# Patient Record
Sex: Female | Born: 1975 | Race: Black or African American | Hispanic: No | Marital: Married | Smoking: Never smoker
Health system: Southern US, Community
[De-identification: ages and names within clinical notes are randomized; demographics above are authoritative.]

## PROBLEM LIST (undated history)

## (undated) DIAGNOSIS — T8859XA Other complications of anesthesia, initial encounter: Secondary | ICD-10-CM

## (undated) DIAGNOSIS — Z9889 Other specified postprocedural states: Secondary | ICD-10-CM

## (undated) DIAGNOSIS — R112 Nausea with vomiting, unspecified: Secondary | ICD-10-CM

## (undated) DIAGNOSIS — K219 Gastro-esophageal reflux disease without esophagitis: Secondary | ICD-10-CM

## (undated) DIAGNOSIS — T4145XA Adverse effect of unspecified anesthetic, initial encounter: Secondary | ICD-10-CM

## (undated) DIAGNOSIS — E282 Polycystic ovarian syndrome: Secondary | ICD-10-CM

## (undated) DIAGNOSIS — I499 Cardiac arrhythmia, unspecified: Secondary | ICD-10-CM

## (undated) HISTORY — PX: OTHER SURGICAL HISTORY: SHX169

## (undated) HISTORY — PX: BREAST BIOPSY: SHX20

---
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
## 1998-04-23 ENCOUNTER — Emergency Department (HOSPITAL_COMMUNITY): Admission: EM | Admit: 1998-04-23 | Discharge: 1998-04-23 | Payer: Self-pay | Admitting: Emergency Medicine

## 1998-09-04 ENCOUNTER — Emergency Department (HOSPITAL_COMMUNITY): Admission: EM | Admit: 1998-09-04 | Discharge: 1998-09-04 | Payer: Self-pay

## 1998-09-06 ENCOUNTER — Encounter: Payer: Self-pay | Admitting: Emergency Medicine

## 1998-09-06 ENCOUNTER — Ambulatory Visit (HOSPITAL_COMMUNITY): Admission: RE | Admit: 1998-09-06 | Discharge: 1998-09-06 | Payer: Self-pay | Admitting: Emergency Medicine

## 1998-09-09 ENCOUNTER — Emergency Department (HOSPITAL_COMMUNITY): Admission: EM | Admit: 1998-09-09 | Discharge: 1998-09-09 | Payer: Self-pay | Admitting: Emergency Medicine

## 1998-09-09 ENCOUNTER — Encounter: Payer: Self-pay | Admitting: Emergency Medicine

## 1999-05-23 ENCOUNTER — Emergency Department (HOSPITAL_COMMUNITY): Admission: EM | Admit: 1999-05-23 | Discharge: 1999-05-23 | Payer: Self-pay | Admitting: Emergency Medicine

## 2005-08-31 ENCOUNTER — Other Ambulatory Visit: Admission: RE | Admit: 2005-08-31 | Discharge: 2005-08-31 | Payer: Self-pay | Admitting: Obstetrics and Gynecology

## 2006-03-05 ENCOUNTER — Other Ambulatory Visit: Admission: RE | Admit: 2006-03-05 | Discharge: 2006-03-05 | Payer: Self-pay | Admitting: Obstetrics and Gynecology

## 2006-06-20 ENCOUNTER — Other Ambulatory Visit: Admission: RE | Admit: 2006-06-20 | Discharge: 2006-06-20 | Payer: Self-pay | Admitting: Obstetrics and Gynecology

## 2006-07-21 ENCOUNTER — Emergency Department (HOSPITAL_COMMUNITY): Admission: EM | Admit: 2006-07-21 | Discharge: 2006-07-21 | Payer: Self-pay | Admitting: Emergency Medicine

## 2006-10-22 ENCOUNTER — Encounter: Admission: RE | Admit: 2006-10-22 | Discharge: 2006-10-22 | Payer: Self-pay | Admitting: Obstetrics and Gynecology

## 2007-02-19 ENCOUNTER — Other Ambulatory Visit: Admission: RE | Admit: 2007-02-19 | Discharge: 2007-02-19 | Payer: Self-pay | Admitting: Obstetrics and Gynecology

## 2007-09-24 ENCOUNTER — Other Ambulatory Visit: Admission: RE | Admit: 2007-09-24 | Discharge: 2007-09-24 | Payer: Self-pay | Admitting: Obstetrics and Gynecology

## 2008-04-07 ENCOUNTER — Encounter: Payer: Self-pay | Admitting: Internal Medicine

## 2008-04-22 ENCOUNTER — Encounter: Payer: Self-pay | Admitting: Internal Medicine

## 2008-04-22 ENCOUNTER — Ambulatory Visit (HOSPITAL_COMMUNITY): Admission: RE | Admit: 2008-04-22 | Discharge: 2008-04-22 | Payer: Self-pay | Admitting: Family Medicine

## 2008-05-15 ENCOUNTER — Ambulatory Visit: Payer: Self-pay | Admitting: Internal Medicine

## 2008-05-15 DIAGNOSIS — B977 Papillomavirus as the cause of diseases classified elsewhere: Secondary | ICD-10-CM | POA: Insufficient documentation

## 2008-05-15 DIAGNOSIS — R05 Cough: Secondary | ICD-10-CM

## 2008-05-15 DIAGNOSIS — K219 Gastro-esophageal reflux disease without esophagitis: Secondary | ICD-10-CM | POA: Insufficient documentation

## 2008-05-15 DIAGNOSIS — R059 Cough, unspecified: Secondary | ICD-10-CM | POA: Insufficient documentation

## 2008-05-15 DIAGNOSIS — J42 Unspecified chronic bronchitis: Secondary | ICD-10-CM | POA: Insufficient documentation

## 2008-05-15 DIAGNOSIS — E282 Polycystic ovarian syndrome: Secondary | ICD-10-CM | POA: Insufficient documentation

## 2008-11-02 ENCOUNTER — Other Ambulatory Visit: Admission: RE | Admit: 2008-11-02 | Discharge: 2008-11-02 | Payer: Self-pay | Admitting: Obstetrics and Gynecology

## 2009-11-01 ENCOUNTER — Other Ambulatory Visit: Admission: RE | Admit: 2009-11-01 | Discharge: 2009-11-01 | Payer: Self-pay | Admitting: Obstetrics and Gynecology

## 2009-11-02 ENCOUNTER — Encounter: Admission: RE | Admit: 2009-11-02 | Discharge: 2009-11-02 | Payer: Self-pay | Admitting: Obstetrics and Gynecology

## 2010-11-03 ENCOUNTER — Other Ambulatory Visit (HOSPITAL_COMMUNITY)
Admission: RE | Admit: 2010-11-03 | Discharge: 2010-11-03 | Disposition: A | Discharge status: Home / Self Care | Payer: BC Managed Care – PPO | Source: Ambulatory Visit | Attending: Obstetrics and Gynecology | Admitting: Obstetrics and Gynecology

## 2010-11-03 ENCOUNTER — Other Ambulatory Visit: Payer: Self-pay | Admitting: Obstetrics and Gynecology

## 2010-11-03 DIAGNOSIS — Z113 Encounter for screening for infections with a predominantly sexual mode of transmission: Secondary | ICD-10-CM | POA: Insufficient documentation

## 2010-11-03 DIAGNOSIS — R87810 Cervical high risk human papillomavirus (HPV) DNA test positive: Secondary | ICD-10-CM | POA: Insufficient documentation

## 2010-11-03 DIAGNOSIS — Z01419 Encounter for gynecological examination (general) (routine) without abnormal findings: Secondary | ICD-10-CM | POA: Insufficient documentation

## 2011-08-18 ENCOUNTER — Encounter (HOSPITAL_COMMUNITY): Payer: Self-pay | Admitting: Emergency Medicine

## 2011-08-18 ENCOUNTER — Emergency Department (HOSPITAL_COMMUNITY)
Admission: EM | Admit: 2011-08-18 | Discharge: 2011-08-18 | Disposition: A | Discharge status: Home / Self Care | Payer: BC Managed Care – PPO | Attending: Emergency Medicine | Admitting: Emergency Medicine

## 2011-08-18 DIAGNOSIS — M62838 Other muscle spasm: Secondary | ICD-10-CM | POA: Insufficient documentation

## 2011-08-18 DIAGNOSIS — M25519 Pain in unspecified shoulder: Secondary | ICD-10-CM | POA: Insufficient documentation

## 2011-08-18 DIAGNOSIS — R110 Nausea: Secondary | ICD-10-CM | POA: Insufficient documentation

## 2011-08-18 HISTORY — DX: Polycystic ovarian syndrome: E28.2

## 2011-08-18 HISTORY — DX: Gastro-esophageal reflux disease without esophagitis: K21.9

## 2011-08-18 MED ORDER — PROMETHAZINE HCL 25 MG PO TABS: 12.5000 mg | ORAL_TABLET | Freq: Four times a day (QID) | ORAL | Status: DC | PRN

## 2011-08-18 MED ORDER — METHOCARBAMOL 500 MG PO TABS: 1000.0000 mg | ORAL_TABLET | Freq: Four times a day (QID) | ORAL | Status: AC

## 2011-08-18 MED ORDER — HYDROCODONE-ACETAMINOPHEN 5-325 MG PO TABS: ORAL_TABLET | ORAL | Status: AC

## 2011-08-18 MED ORDER — NAPROXEN 500 MG PO TABS: 500.0000 mg | ORAL_TABLET | Freq: Two times a day (BID) | ORAL | Status: AC

## 2011-08-18 NOTE — ED Provider Notes (Signed)
Medical screening examination/treatment/procedure(s) were performed by non-physician practitioner and as supervising physician I was immediately available for consultation/collaboration.   Hanley Seamen, MD 08/18/11 1235

## 2011-08-18 NOTE — ED Provider Notes (Signed)
History     CSN: [id number]  Arrival date & time 08/18/11  5284   First MD Initiated Contact with Patient 08/18/11 [phone]      Chief Complaint  Patient presents with  . Shoulder Pain    (Consider location/radiation/quality/duration/timing/severity/associated sxs/prior treatment) HPI Comments: Patient present in ED with 9/10 L shoulder pain.  She reports folding laundry but did not have exact mechanism.  The pain is shooting and travels down to elbow but spares forearm and wrist.  Denies neck pain, chest pain, dyspnea, numbness and tingling.  No prev hx of L shoulder/elbow injury.  No prev hx of neck pain or injury.  Reports moderate reflux therefore she can only take Aleve for pain relief.  States she took a Vicodin last night but it made her nauseated.  No allergies       Patient is a 36 y.o. female presenting with shoulder pain. The history is provided by the patient.  Shoulder Pain This is a new problem. The current episode started yesterday. The problem occurs constantly. The problem has been unchanged. Associated symptoms include arthralgias and nausea. Pertinent negatives include no abdominal pain, chest pain, diaphoresis, fever, neck pain, numbness, vomiting or weakness. The symptoms are aggravated by bending. She has tried oral narcotics for the symptoms. The treatment provided mild relief.    Past Medical History  Diagnosis Date  . GERD (gastroesophageal reflux disease)   . PCOS (polycystic ovarian syndrome)     Past Surgical History  Procedure Date  . Removal of fibroids     History reviewed. No pertinent family history.  History  Substance Use Topics  . Smoking status: Never Smoker   . Smokeless tobacco: Not on file  . Alcohol Use: No    OB History    Grav Para Term Preterm Abortions TAB SAB Ect Mult Living                  Review of Systems  Constitutional: Negative for fever and diaphoresis.  HENT: Negative for neck pain and neck stiffness.   Respiratory:  Negative for chest tightness and shortness of breath.   Cardiovascular: Negative for chest pain.  Gastrointestinal: Positive for nausea. Negative for vomiting and abdominal pain.  Musculoskeletal: Positive for arthralgias. Negative for back pain.  Skin: Negative for color change and wound.  Neurological: Negative for weakness and numbness.    Allergies  Review of patient's allergies indicates no known allergies.  Home Medications   Current Outpatient Rx  Name Route Sig Dispense Refill  . VITAMIN D 1000 UNITS PO TABS Oral Take 1,000 Units by mouth daily.    Marland Kitchen OMEPRAZOLE 20 MG PO CPDR Oral Take 20 mg by mouth daily.    Marland Kitchen PRENATAL MULTIVITAMIN CH Oral Take 1 tablet by mouth daily.    Marland Kitchen PROMETHAZINE HCL 12.5 MG PO TABS Oral Take 12.5 mg by mouth every 6 (six) hours as needed. nausea      BP 126/83  Pulse 83  Temp(Src) 98 F (36.7 C) (Oral)  Resp 16  SpO2 100%  LMP 07/27/2011  Physical Exam  Nursing note and vitals reviewed. Constitutional: She is oriented to person, place, and time. She appears well-developed and well-nourished.  HENT:  Head: Normocephalic and atraumatic.  Eyes: Pupils are equal, round, and reactive to light.  Neck: Neck supple. Muscular tenderness present. No spinous process tenderness present. Decreased range of motion present.       Decreased Right lateral flexion due to muscle spasm  Cardiovascular: Normal rate, regular rhythm, normal heart sounds and intact distal pulses.   Pulmonary/Chest: Effort normal and breath sounds normal.  Musculoskeletal: She exhibits no edema and no tenderness.       Left shoulder: She exhibits decreased range of motion, tenderness, pain, spasm and decreased strength. She exhibits no bony tenderness, no crepitus, no deformity and normal pulse.       Left elbow: She exhibits normal range of motion, no swelling and no deformity. no tenderness found.       Arms: Neurological: She is alert and oriented to person, place, and time.        Distal motor, sensation, and vascular intact.   Skin: Skin is warm and dry. No erythema.  Psychiatric: She has a normal mood and affect. Her behavior is normal.    ED Course  Procedures (including critical care time)  Labs Reviewed - No data to display No results found.   1. Shoulder pain     8:10 AM Patient seen and examined.  8:10 AM Patient was counseled on RICE protocol and told to rest injury, use ice for no longer than 15 minutes every hour.  Questions answered.  Patient verbalized understanding. Sling given. Ortho referral given.  8:11 AM Patient counseled on use of narcotic pain medications. Counseled not to combine these medications with others containing tylenol. Urged not to drink alcohol, drive, or perform any other activities that requires focus while taking these medications. The patient verbalizes understanding and agrees with the plan. 8:11 AM Patient counseled on proper use of muscle relaxant medication.  They were told not to drink alcohol, drive any vehicle, or do any dangerous activities while taking this medication.  Patient verbalized understanding.    MDM  Shoulder pain, no concern for fracture given lack of significant mechanism. X-ray deferred. RICE and sling indicated. Ortho follow-up indicated if pain persists.         Eustace Moore [place], [place] 08/18/11 [phone]

## 2011-08-18 NOTE — ED Notes (Addendum)
Pt presented to the ER with c/o left shoulder pain, 9/10, that started earlier today, around 2100, pt denies any injury to the area, no apparent deformity, pt not able, w/o difficulty, to elevated the arm above the shoulder, tender to touch. Pulses present even and strong bilaterally.

## 2011-11-06 ENCOUNTER — Other Ambulatory Visit (HOSPITAL_COMMUNITY)
Admission: RE | Admit: 2011-11-06 | Discharge: 2011-11-06 | Disposition: A | Discharge status: Home / Self Care | Payer: BC Managed Care – PPO | Source: Ambulatory Visit | Attending: Obstetrics and Gynecology | Admitting: Obstetrics and Gynecology

## 2011-11-06 ENCOUNTER — Other Ambulatory Visit: Payer: Self-pay | Admitting: Obstetrics and Gynecology

## 2011-11-06 DIAGNOSIS — Z01419 Encounter for gynecological examination (general) (routine) without abnormal findings: Secondary | ICD-10-CM | POA: Insufficient documentation

## 2012-11-06 ENCOUNTER — Other Ambulatory Visit: Payer: Self-pay | Admitting: Obstetrics and Gynecology

## 2012-11-06 ENCOUNTER — Other Ambulatory Visit (HOSPITAL_COMMUNITY)
Admission: RE | Admit: 2012-11-06 | Discharge: 2012-11-06 | Disposition: A | Discharge status: Home / Self Care | Payer: 59 | Source: Ambulatory Visit | Attending: Obstetrics and Gynecology | Admitting: Obstetrics and Gynecology

## 2012-11-06 DIAGNOSIS — Z113 Encounter for screening for infections with a predominantly sexual mode of transmission: Secondary | ICD-10-CM | POA: Insufficient documentation

## 2012-11-06 DIAGNOSIS — Z01419 Encounter for gynecological examination (general) (routine) without abnormal findings: Secondary | ICD-10-CM | POA: Insufficient documentation

## 2013-09-26 ENCOUNTER — Other Ambulatory Visit: Payer: Self-pay | Admitting: Physician Assistant

## 2013-09-26 ENCOUNTER — Ambulatory Visit
Admission: RE | Admit: 2013-09-26 | Discharge: 2013-09-26 | Disposition: A | Discharge status: Home / Self Care | Payer: 59 | Source: Ambulatory Visit | Attending: Physician Assistant | Admitting: Physician Assistant

## 2013-09-26 DIAGNOSIS — R05 Cough: Secondary | ICD-10-CM

## 2013-09-26 DIAGNOSIS — R059 Cough, unspecified: Secondary | ICD-10-CM

## 2013-09-29 ENCOUNTER — Telehealth: Payer: Self-pay | Admitting: Internal Medicine

## 2013-09-29 NOTE — Telephone Encounter (Signed)
C/D 09/29/13 for appt. 10/09/13

## 2013-09-29 NOTE — Telephone Encounter (Signed)
NEW PATIENT SCHEDULED FOR 03/05 @ 10:30 W/DR. Cardington.  [facility] PACKET MAILED.

## 2013-10-09 ENCOUNTER — Telehealth: Payer: Self-pay | Admitting: Internal Medicine

## 2013-10-09 ENCOUNTER — Other Ambulatory Visit: Payer: Self-pay | Admitting: Internal Medicine

## 2013-10-09 ENCOUNTER — Other Ambulatory Visit (HOSPITAL_BASED_OUTPATIENT_CLINIC_OR_DEPARTMENT_OTHER): Payer: 59

## 2013-10-09 ENCOUNTER — Ambulatory Visit: Payer: 59

## 2013-10-09 ENCOUNTER — Ambulatory Visit (HOSPITAL_BASED_OUTPATIENT_CLINIC_OR_DEPARTMENT_OTHER): Payer: 59 | Admitting: Internal Medicine

## 2013-10-09 ENCOUNTER — Encounter: Payer: Self-pay | Admitting: Internal Medicine

## 2013-10-09 VITALS — BP 131/78 | HR 92 | Temp 98.8°F | Resp 19 | Ht 60.0 in | Wt 210.7 lb

## 2013-10-09 DIAGNOSIS — D75839 Thrombocytosis, unspecified: Secondary | ICD-10-CM

## 2013-10-09 DIAGNOSIS — D75838 Other thrombocytosis: Secondary | ICD-10-CM | POA: Insufficient documentation

## 2013-10-09 DIAGNOSIS — R718 Other abnormality of red blood cells: Secondary | ICD-10-CM

## 2013-10-09 DIAGNOSIS — K219 Gastro-esophageal reflux disease without esophagitis: Secondary | ICD-10-CM

## 2013-10-09 DIAGNOSIS — D473 Essential (hemorrhagic) thrombocythemia: Secondary | ICD-10-CM

## 2013-10-09 DIAGNOSIS — R7989 Other specified abnormal findings of blood chemistry: Secondary | ICD-10-CM | POA: Insufficient documentation

## 2013-10-09 LAB — COMPREHENSIVE METABOLIC PANEL (CC13)
ALBUMIN: 4.2 g/dL (ref 3.5–5.0)
ALK PHOS: 87 U/L (ref 40–150)
ALT: 21 U/L (ref 0–55)
ANION GAP: 10 meq/L (ref 3–11)
AST: 28 U/L (ref 5–34)
BILIRUBIN TOTAL: 0.48 mg/dL (ref 0.20–1.20)
BUN: 7.1 mg/dL (ref 7.0–26.0)
CALCIUM: 9.9 mg/dL (ref 8.4–10.4)
CHLORIDE: 104 meq/L (ref 98–109)
CO2: 26 meq/L (ref 22–29)
Creatinine: 0.8 mg/dL (ref 0.6–1.1)
Glucose: 87 mg/dl (ref 70–140)
Potassium: 3.9 mEq/L (ref 3.5–5.1)
SODIUM: 140 meq/L (ref 136–145)
TOTAL PROTEIN: 7.9 g/dL (ref 6.4–8.3)

## 2013-10-09 LAB — CBC WITH DIFFERENTIAL/PLATELET
BASO%: 0.4 % (ref 0.0–2.0)
BASOS ABS: 0 10*3/uL (ref 0.0–0.1)
EOS%: 1.2 % (ref 0.0–7.0)
Eosinophils Absolute: 0.1 10*3/uL (ref 0.0–0.5)
HCT: 39.5 % (ref 34.8–46.6)
HGB: 12.9 g/dL (ref 11.6–15.9)
LYMPH#: 1.5 10*3/uL (ref 0.9–3.3)
LYMPH%: 26.2 % (ref 14.0–49.7)
MCH: 25.2 pg (ref 25.1–34.0)
MCHC: 32.7 g/dL (ref 31.5–36.0)
MCV: 77 fL — ABNORMAL LOW (ref 79.5–101.0)
MONO#: 0.6 10*3/uL (ref 0.1–0.9)
MONO%: 10 % (ref 0.0–14.0)
NEUT#: 3.6 10*3/uL (ref 1.5–6.5)
NEUT%: 62.2 % (ref 38.4–76.8)
PLATELETS: 547 10*3/uL — AB (ref 145–400)
RBC: 5.13 10*6/uL (ref 3.70–5.45)
RDW: 14.5 % (ref 11.2–14.5)
WBC: 5.8 10*3/uL (ref 3.9–10.3)

## 2013-10-09 LAB — IRON AND TIBC CHCC
%SAT: 20 % — ABNORMAL LOW (ref 21–57)
Iron: 83 ug/dL (ref 41–142)
TIBC: 418 ug/dL (ref 236–444)
UIBC: 335 ug/dL (ref 120–384)

## 2013-10-09 LAB — CHCC SMEAR

## 2013-10-09 LAB — LACTATE DEHYDROGENASE (CC13): LDH: 180 U/L (ref 125–245)

## 2013-10-09 LAB — FERRITIN CHCC: FERRITIN: 62 ng/mL (ref 9–269)

## 2013-10-09 MED ORDER — FERROUS SULFATE 325 (65 FE) MG PO TBEC: 325.0000 mg | DELAYED_RELEASE_TABLET | Freq: Two times a day (BID) | ORAL | Status: DC

## 2013-10-09 NOTE — Progress Notes (Signed)
Checked in new patient with no financial issues. She has appt card. °

## 2013-10-09 NOTE — Telephone Encounter (Signed)
Gave pt appt for lab and MD for April and MAy 2015

## 2013-10-09 NOTE — Patient Instructions (Signed)
Anemia, Nonspecific Anemia is a condition in which the concentration of red blood cells or hemoglobin in the blood is below normal. Hemoglobin is a substance in red blood cells that carries oxygen to the tissues of the body. Anemia results in not enough oxygen reaching these tissues.  CAUSES  Common causes of anemia include:   Excessive bleeding. Bleeding may be internal or external. This includes excessive bleeding from periods (in women) or from the intestine.   Poor nutrition.   Chronic kidney, thyroid, and liver disease.  Bone marrow disorders that decrease red blood cell production.  Cancer and treatments for cancer.  HIV, AIDS, and their treatments.  Spleen problems that increase red blood cell destruction.  Blood disorders.  Excess destruction of red blood cells due to infection, medicines, and autoimmune disorders. SIGNS AND SYMPTOMS   Minor weakness.   Dizziness.   Headache.  Palpitations.   Shortness of breath, especially with exercise.   Paleness.  Cold sensitivity.  Indigestion.  Nausea.  Difficulty sleeping.  Difficulty concentrating. Symptoms may occur suddenly or they may develop slowly.  DIAGNOSIS  Additional blood tests are often needed. These help your health care provider determine the best treatment. Your health care provider will check your stool for blood and look for other causes of blood loss.  TREATMENT  Treatment varies depending on the cause of the anemia. Treatment can include:   Supplements of iron, vitamin J82, or folic acid.   Hormone medicines.   A blood transfusion. This may be needed if blood loss is severe.   Hospitalization. This may be needed if there is significant continual blood loss.   Dietary changes.  Spleen removal. HOME CARE INSTRUCTIONS Keep all follow-up appointments. It often takes many weeks to correct anemia, and having your health care provider check on your condition and your response to  treatment is very important. SEEK IMMEDIATE MEDICAL CARE IF:   You develop extreme weakness, shortness of breath, or chest pain.   You become dizzy or have trouble concentrating.  You develop heavy vaginal bleeding.   You develop a rash.   You have bloody or black, tarry stools.   You faint.   You vomit up blood.   You vomit repeatedly.   You have abdominal pain.  You have a fever or persistent symptoms for more than 2 3 days.   You have a fever and your symptoms suddenly get worse.   You are dehydrated.  MAKE SURE YOU:  Understand these instructions.  Will watch your condition.  Will get help right away if you are not doing well or get worse. Document Released: 08/31/2004 Document Revised: 03/26/2013 Document Reviewed: 01/17/2013 [facility] Patient Information 2014 Center. Platelet Count The platelet count is the number of platelets per mm in a sample of your blood. Platelets are an important part of the blood's clotting process. This test is used to evaluate patients who develop bleeding disorders. PREPARATION FOR TEST No preparation or fasting is needed.  Avoid strenuous exercise prior to this test.  For females, inform your caregiver if you are pregnant or menstruating. NORMAL FINDINGS  Adult/elderly: 150,000 to 400,000/mm or 150 to 400 x 109/L (SI units)  Premature infant: 100,000 to 300,000/ mm  Newborn: 150,000 to 300,000/ mm  Infant: 200,000 to 475,000/ mm  Child: 150,000 to 400,000/ mm Possible critical values: less than 50,000 or greater than 1 million/ mm Ranges for normal findings may vary among different laboratories and hospitals. You should always check with your  caregiver after having lab work or other tests done to discuss the meaning of your test results and whether your values are considered within normal limits. MEANING OF TEST  Your caregiver will go over the test results with you. Your caregiver will discuss the  importance and meaning of your results. He or she will also discuss treatment options and additional tests, if needed. OBTAINING THE TEST RESULTS It is your responsibility to obtain your test results. Ask the lab or department performing the test when and how you will get your results. Document Released: 08/18/2004 Document Revised: 10/16/2011 Document Reviewed: 07/05/2008 [facility] Patient Information 2014 [place], [place].

## 2013-10-12 ENCOUNTER — Encounter: Payer: Self-pay | Admitting: Internal Medicine

## 2013-10-12 NOTE — Progress Notes (Signed)
[place] [phone]) [phone]   [phone]  NEW PATIENT EVALUATION   Name: Haley Suarez Date: 10/12/2013 MRN: [id number] DOB: Sep 27, 1975  PCP: REDMON,NOELLE, PA-C   REFERRING PHYSICIAN: Redmon, Noelle, PA-C  REASON FOR REFERRAL: Thrombocytosis    HISTORY OF PRESENT ILLNESS:Haley Suarez is a 38 y.o. female who is being referred to our office for evaluation of thrombocytosis. She is good historian.  She was last seen by PA Noelle Redmon on 09/26/2013.   She reports five days prior to this visit, she felt lightheaded and reported to urgent care.  Her BP was elevated at 160/90.  She complains of fatigue, hair lost and tightness in her throat.  She was told that she may have a thyroid but the thyroid work up was negative.  She has not been having any fever or URI symptoms.  Her CBC in urgent care revealed an elevated plt count of 517 with a normal WBC and hgb.  Based on this elevation, she was concerned and requested a referral to hematology. Her CBC on 11/06/2012 revealed a WBC of 6.3; hgb of 12.9 with a low MCV of 784. (81 - 99) and Plts of 467.  CBC on 11/01/2009 revealed a WBC of 5.4 hgb of 12.3 and MCV 61f 81.4 (which is normal) with plts of 442.  On 11/23/2008, her wBC was 8.4; hgb of 12.0; MCV of 79.1; plra od 445.   Of note, she has a longstanding history of GERD and has been on medications for this since age 32.  She reports a tingling sensation in her mouth and sometimes feels that her tongue is thick and swollen without any dyspnea doing these episodes.  She reports a chronic cough. She is scheduled to see gastroenterology today (Dr. Penelope Coop) and is scheduled for an endoscopy.   Her recent CXR was negative. Six months ago, she reports a allergic reaction to a vitamin pack from [facility]. This has now resolved.  She has a history of polycystic ovarian syndrome managed by Dr. Landry Mellow.  She denies a history of post showering pruritus.  She does endorse insomnia.  Her  hair lost has occurred over the past 5 months.  She denies any weight lost.  During her visit with PA Redmon she was provided dexilant for her GERD.   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  She reports that 2 months ago, she had her menstrual period and it was described as regular with normal flow of 2-3 pads per day for 4-5 days.    PAST MEDICAL HISTORY:  has a past medical history of GERD (gastroesophageal reflux disease) and PCOS (polycystic ovarian syndrome).     PAST SURGICAL HISTORY: Past Surgical History  Procedure Laterality Date  . Removal of fibroids       CURRENT MEDICATIONS: has a current medication list which includes the following prescription(s): cholecalciferol, dexlansoprazole, omeprazole, prenatal multivitamin, promethazine, ferrous sulfate, and promethazine.   ALLERGIES: Review of patient's allergies indicates no known allergies.   SOCIAL HISTORY:  reports that she has never smoked. She does not have any smokeless tobacco history on file. She reports that she does not drink alcohol or use illicit drugs.   FAMILY HISTORY: family history includes Cancer in her maternal grandmother; Fibromyalgia in her mother; GER disease in her father; Hepatitis C in her mother; Ovarian cancer in her mother.   LABORATORY DATA:  CBC    Component Value  Date/Time   WBC 5.8 10/09/2013 1043   RBC 5.13 10/09/2013 1043   HGB 12.9 10/09/2013 1043   HCT 39.5 10/09/2013 1043   PLT 547* 10/09/2013 1043   MCV 77.0* 10/09/2013 1043   MCH 25.2 10/09/2013 1043   MCHC 32.7 10/09/2013 1043   RDW 14.5 10/09/2013 1043   LYMPHSABS 1.5 10/09/2013 1043   MONOABS 0.6 10/09/2013 1043   EOSABS 0.1 10/09/2013 1043   BASOSABS 0.0 10/09/2013 1043    CMP     Component Value Date/Time   NA 140 10/09/2013 1043   K 3.9 10/09/2013 1043   CO2 26 10/09/2013 1043   GLUCOSE 87 10/09/2013 1043   BUN 7.1 10/09/2013 1043   CREATININE 0.8 10/09/2013 1043   CALCIUM 9.9 10/09/2013  1043   PROT 7.9 10/09/2013 1043   ALBUMIN 4.2 10/09/2013 1043   AST 28 10/09/2013 1043   ALT 21 10/09/2013 1043   ALKPHOS 87 10/09/2013 1043   BILITOT 0.48 10/09/2013 1043   Iron/TIBC/Ferritin    Component Value Date/Time   IRON 83 10/09/2013 1205   TIBC 418 10/09/2013 1205   FERRITIN 62 10/09/2013 1205     RADIOGRAPHY: Dg Chest 2 View  09/26/2013   CLINICAL DATA:  Cough with fatigue and dysphagia for several months. Nonsmoker.  EXAM: CHEST  2 VIEW  COMPARISON:  CHEST-2V dated 04/03/2008  FINDINGS: The heart size and mediastinal contours are normal. The lungs are clear. There is no pleural effusion or pneumothorax. No acute osseous findings are identified.  IMPRESSION: No active cardiopulmonary process.   Electronically Signed   By: Camie Patience M.D.   On: 09/26/2013 12:13       REVIEW OF SYSTEMS:  Constitutional: Denies fevers, chills or abnormal weight loss Eyes: Denies blurriness of vision Ears, nose, mouth, throat, and face: Denies mucositis or sore throat Respiratory: Denies cough, dyspnea or wheezes Cardiovascular: Denies palpitation, chest discomfort or lower extremity swelling Gastrointestinal:  Denies nausea, heartburn or change in bowel habits Skin: Denies abnormal skin rashes Lymphatics: Denies new lymphadenopathy or easy bruising Neurological:Denies numbness, tingling or new weaknesses Behavioral/Psych: Mood is stable, no new changes  All other systems were reviewed with the patient and are negative.  PHYSICAL EXAM:  height is 5' (1.524 m) and weight is 210 lb 11.2 oz (95.573 kg). Her oral temperature is 98.8 F (37.1 C). Her blood pressure is 131/78 and her pulse is 92. Her respiration is 19.    GENERAL:alert, no distress and comfortable; well developed and well nourished.  SKIN: skin color, texture, turgor are normal, no rashes or significant lesions EYES: normal, Conjunctiva are pink and non-injected, sclera clear OROPHARYNX:no exudate, no erythema and lips, buccal mucosa, and  tongue normal  NECK: supple, thyroid normal size, non-tender, without nodularity LYMPH:  no palpable lymphadenopathy in the cervical, axillary or inguinal LUNGS: clear to auscultation and percussion with normal breathing effort HEART: regular rate & rhythm and no murmurs and no lower extremity edema ABDOMEN:abdomen soft, non-tender and normal bowel sounds Musculoskeletal:no cyanosis of digits and no clubbing  NEURO: alert & oriented x 3 with fluent speech, no focal motor/sensory deficits   IMPRESSION: Esli C Suarez is a 38 y.o. female with a history of    PLAN:  1.  Microcytosis NOS. --She may have a mild iron deficiency.  It is a common cause of microcytosis; however, her ferritin is not below 15.  She could also has some chronic inflammation making it slightly elevated.  I will provide her ferrous  sulfate 325 mg daily with vitamin c.  We will see if this resolves her microcytosis. She denies a family history of blood related problems, i.e., thalassemia.   2. Thrombocytosis, mild and chronic. --Likely reactive as evidence by normal wBC and hemoglobin.  Less likely to be a bone marrow problem.  One could also consider myeloproliferative disorder such as essential thrombocythemia.   However, this is more typical in an older age group.  Reactive thrombocytosis is also typical of iron deficiency anemia.  If we notice improvement with iron for #1, potentially her thrombocytosis may resolve.  If it persists of worsens, we will check a JAK2 (which is positive in 50% of ET causes) plus EPO level and vitamin B12.   3. GERD. --She has a follow up with Dr. Penelope Coop for endoscopy.   4. PCOS.  --She is following up with endocrinology.   5. Follow-up.  --We will repeat iron studies and CBC in one month and upon follow up in 2 months.   All questions were answered. The patient knows to call the clinic with any problems, questions or concerns. We can certainly see the patient much sooner if  necessary.  I spent 25 minutes counseling the patient face to face. The total time spent in the appointment was 45 minutes.    Sole Lengacher, MD 10/12/2013 1:09 PM

## 2013-10-16 ENCOUNTER — Other Ambulatory Visit: Payer: Self-pay | Admitting: Gastroenterology

## 2013-11-06 ENCOUNTER — Other Ambulatory Visit: Payer: Self-pay | Admitting: Obstetrics and Gynecology

## 2013-11-06 ENCOUNTER — Other Ambulatory Visit (HOSPITAL_COMMUNITY)
Admission: RE | Admit: 2013-11-06 | Discharge: 2013-11-06 | Disposition: A | Discharge status: Home / Self Care | Payer: 59 | Source: Ambulatory Visit | Attending: Obstetrics and Gynecology | Admitting: Obstetrics and Gynecology

## 2013-11-06 ENCOUNTER — Other Ambulatory Visit: Payer: 59

## 2013-11-06 DIAGNOSIS — Z124 Encounter for screening for malignant neoplasm of cervix: Secondary | ICD-10-CM | POA: Insufficient documentation

## 2013-11-06 DIAGNOSIS — Z1151 Encounter for screening for human papillomavirus (HPV): Secondary | ICD-10-CM | POA: Insufficient documentation

## 2013-11-06 DIAGNOSIS — Z113 Encounter for screening for infections with a predominantly sexual mode of transmission: Secondary | ICD-10-CM | POA: Insufficient documentation

## 2013-11-11 ENCOUNTER — Other Ambulatory Visit (HOSPITAL_BASED_OUTPATIENT_CLINIC_OR_DEPARTMENT_OTHER): Payer: 59

## 2013-11-11 DIAGNOSIS — R718 Other abnormality of red blood cells: Secondary | ICD-10-CM

## 2013-11-11 DIAGNOSIS — D75839 Thrombocytosis, unspecified: Secondary | ICD-10-CM

## 2013-11-11 DIAGNOSIS — D473 Essential (hemorrhagic) thrombocythemia: Secondary | ICD-10-CM

## 2013-11-11 LAB — CBC WITH DIFFERENTIAL/PLATELET
BASO%: 0.4 % (ref 0.0–2.0)
Basophils Absolute: 0 10*3/uL (ref 0.0–0.1)
EOS%: 1.9 % (ref 0.0–7.0)
Eosinophils Absolute: 0.1 10*3/uL (ref 0.0–0.5)
HCT: 36.7 % (ref 34.8–46.6)
HGB: 12 g/dL (ref 11.6–15.9)
LYMPH%: 36.6 % (ref 14.0–49.7)
MCH: 25.3 pg (ref 25.1–34.0)
MCHC: 32.8 g/dL (ref 31.5–36.0)
MCV: 77.2 fL — ABNORMAL LOW (ref 79.5–101.0)
MONO#: 0.8 10*3/uL (ref 0.1–0.9)
MONO%: 12.1 % (ref 0.0–14.0)
NEUT#: 3.3 10*3/uL (ref 1.5–6.5)
NEUT%: 49 % (ref 38.4–76.8)
Platelets: 442 10*3/uL — ABNORMAL HIGH (ref 145–400)
RBC: 4.76 10*6/uL (ref 3.70–5.45)
RDW: 15 % — ABNORMAL HIGH (ref 11.2–14.5)
WBC: 6.7 10*3/uL (ref 3.9–10.3)
lymph#: 2.5 10*3/uL (ref 0.9–3.3)

## 2013-11-11 LAB — IRON AND TIBC CHCC
%SAT: 17 % — AB (ref 21–57)
IRON: 59 ug/dL (ref 41–142)
TIBC: 356 ug/dL (ref 236–444)
UIBC: 297 ug/dL (ref 120–384)

## 2013-11-11 LAB — FERRITIN CHCC: FERRITIN: 20 ng/mL (ref 9–269)

## 2013-12-04 ENCOUNTER — Other Ambulatory Visit: Payer: Self-pay | Admitting: Obstetrics and Gynecology

## 2013-12-09 ENCOUNTER — Other Ambulatory Visit (HOSPITAL_BASED_OUTPATIENT_CLINIC_OR_DEPARTMENT_OTHER): Payer: 59

## 2013-12-09 ENCOUNTER — Ambulatory Visit (HOSPITAL_BASED_OUTPATIENT_CLINIC_OR_DEPARTMENT_OTHER): Payer: 59 | Admitting: Internal Medicine

## 2013-12-09 VITALS — BP 141/77 | HR 92 | Temp 98.7°F | Resp 19 | Ht 60.0 in | Wt 206.6 lb

## 2013-12-09 DIAGNOSIS — R718 Other abnormality of red blood cells: Secondary | ICD-10-CM

## 2013-12-09 DIAGNOSIS — D473 Essential (hemorrhagic) thrombocythemia: Secondary | ICD-10-CM

## 2013-12-09 DIAGNOSIS — D75839 Thrombocytosis, unspecified: Secondary | ICD-10-CM

## 2013-12-09 LAB — FERRITIN CHCC: Ferritin: 27 ng/ml (ref 9–269)

## 2013-12-09 LAB — CBC WITH DIFFERENTIAL/PLATELET
BASO%: 0.3 % (ref 0.0–2.0)
Basophils Absolute: 0 10*3/uL (ref 0.0–0.1)
EOS%: 2.3 % (ref 0.0–7.0)
Eosinophils Absolute: 0.1 10*3/uL (ref 0.0–0.5)
HCT: 38.1 % (ref 34.8–46.6)
HGB: 12.2 g/dL (ref 11.6–15.9)
LYMPH#: 1.7 10*3/uL (ref 0.9–3.3)
LYMPH%: 30.1 % (ref 14.0–49.7)
MCH: 25 pg — ABNORMAL LOW (ref 25.1–34.0)
MCHC: 31.9 g/dL (ref 31.5–36.0)
MCV: 78.3 fL — ABNORMAL LOW (ref 79.5–101.0)
MONO#: 1.3 10*3/uL — ABNORMAL HIGH (ref 0.1–0.9)
MONO%: 23.3 % — ABNORMAL HIGH (ref 0.0–14.0)
NEUT#: 2.5 10*3/uL (ref 1.5–6.5)
NEUT%: 44 % (ref 38.4–76.8)
Platelets: 426 10*3/uL — ABNORMAL HIGH (ref 145–400)
RBC: 4.86 10*6/uL (ref 3.70–5.45)
RDW: 15.3 % — ABNORMAL HIGH (ref 11.2–14.5)
WBC: 5.8 10*3/uL (ref 3.9–10.3)

## 2013-12-09 LAB — IRON AND TIBC CHCC
%SAT: 12 % — ABNORMAL LOW (ref 21–57)
Iron: 48 ug/dL (ref 41–142)
TIBC: 395 ug/dL (ref 236–444)
UIBC: 347 ug/dL (ref 120–384)

## 2013-12-09 MED ORDER — FERROUS SULFATE 325 (65 FE) MG PO TBEC: 325.0000 mg | DELAYED_RELEASE_TABLET | Freq: Every day | ORAL | Status: DC

## 2013-12-09 NOTE — Progress Notes (Signed)
[address], [address] [place] [address]  DIAGNOSIS: Microcytosis  Thrombocytosis  Chief Complaint  Patient presents with  . Follow-up    CURRENT TREATMENT: Ferrous sulfate 325 mg bid for 15 days.    INTERVAL HISTORY: Haley Suarez 38 y.o. female with a history of microcytosis, thrombocytosis. She was last seen by me on 10/09/2013.    As previously noted on initial consultation, she reported five days prior to this visit, she felt lightheaded and reported to urgent care. Her BP was elevated at 160/90. She complained of fatigue, hair lost and tightness in her throat. She was told that she may have a thyroid but the thyroid work up was negative. She had not been having any fever or URI symptoms. Her CBC in urgent care revealed an elevated plt count of 517 with a normal WBC and hgb. Based on this elevation, she was concerned and requested a referral to hematology. Her CBC on 11/06/2012 revealed a WBC of 6.3; hgb of 12.9 with a low MCV of 784. (81 - 99) and Plts of 467. CBC on 11/01/2009 revealed a WBC of 5.4 hgb of 12.3 and MCV 79f 81.4 (which is normal) with plts of 442. On 11/23/2008, her wBC was 8.4; hgb of 12.0; MCV of 79.1; plt of 445.   Of note, she has a longstanding history of GERD and has been on medications for this since age 21. She reports a tingling sensation in her mouth and sometimes feels that her tongue is thick and swollen without any dyspnea doing these episodes. She reports a chronic cough. Her recent CXR was negative. Six months ago, she reports a allergic reaction to a vitamin pack from [facility]. This has now resolved. She has a history of polycystic ovarian syndrome managed by Dr. Landry Mellow. She denies a history of post showering pruritus. She does endorse insomnia. Her hair lost has occurred over the past 5 months. She denies any weight lost. During her visit with PA Redmon she was provided dexilant for  her GERD. 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   Today, she saw Dr. Penelope Coop who did an EGD which was normal.  She saw Dr. Christophe Louis of gynecology and she had an abnormal pap that was irregular and had a colposcopy this past Thursday with pathologies pending. She denies any hospitalizations or emergency room visits.  She does report allergy-like symptoms with coughing.  Her LMP was from 04/16 - 04/21.   She had about 3 pads per day.   MEDICAL HISTORY: Past Medical History  Diagnosis Date  . GERD (gastroesophageal reflux disease)   . PCOS (polycystic ovarian syndrome)     INTERIM HISTORY: has HPV; POLYCYSTIC OVARIAN DISEASE; BRONCHITIS, CHRONIC; G E R D; COUGH; Thrombocytosis; and Microcytosis on her problem list.    ALLERGIES:  has No Known Allergies.  MEDICATIONS: has a current medication list which includes the following prescription(s): cholecalciferol, dexlansoprazole, ferrous sulfate, omeprazole, prenatal multivitamin, promethazine, and promethazine.  SURGICAL HISTORY:  Past Surgical History  Procedure Laterality Date  . Removal of fibroids      REVIEW OF SYSTEMS:   Constitutional: Denies fevers, chills or abnormal weight loss Eyes: Denies blurriness of vision Ears, nose, mouth, throat, and face: Denies mucositis or sore throat Respiratory: Denies cough, dyspnea or wheezes Cardiovascular: Denies palpitation, chest discomfort or lower extremity swelling Gastrointestinal:  Denies nausea, heartburn or  change in bowel habits Skin: Denies abnormal skin rashes Lymphatics: Denies new lymphadenopathy or easy bruising Neurological:Denies numbness, tingling or new weaknesses Behavioral/Psych: Mood is stable, no new changes  All other systems were reviewed with the patient and are negative.  PHYSICAL EXAMINATION: ECOG PERFORMANCE STATUS: 0 - Asymptomatic  Blood pressure 141/77, pulse 92, temperature 98.7 F (37.1 C),  temperature source Oral, resp. rate 19, height 5' (1.524 m), weight 206 lb 9.6 oz (93.713 kg).  GENERAL:alert, no distress and comfortable; obese, well developed.  SKIN: skin color, texture, turgor are normal, no rashes or significant lesions EYES: normal, Conjunctiva are pink and non-injected, sclera clear OROPHARYNX:no exudate, no erythema and lips, buccal mucosa, and tongue normal  NECK: supple, thyroid normal size, non-tender, without nodularity LYMPH:  no palpable lymphadenopathy in the cervical, axillary or supraclavicular LUNGS: clear to auscultation with normal breathing effort, no wheezes or rhonchi HEART: regular rate & rhythm and no murmurs and no lower extremity edema ABDOMEN:abdomen soft, non-tender and normal bowel sounds Musculoskeletal:no cyanosis of digits and no clubbing  NEURO: alert & oriented x 3 with fluent speech, no focal motor/sensory deficits  Labs:  Lab Results  Component Value Date   WBC 5.8 12/09/2013   HGB 12.2 12/09/2013   HCT 38.1 12/09/2013   MCV 78.3* 12/09/2013   PLT 426* 12/09/2013   NEUTROABS 2.5 12/09/2013      Chemistry      Component Value Date/Time   NA 140 10/09/2013 1043   K 3.9 10/09/2013 1043   CO2 26 10/09/2013 1043   BUN 7.1 10/09/2013 1043   CREATININE 0.8 10/09/2013 1043      Component Value Date/Time   CALCIUM 9.9 10/09/2013 1043   ALKPHOS 87 10/09/2013 1043   AST 28 10/09/2013 1043   ALT 21 10/09/2013 1043   BILITOT 0.48 10/09/2013 1043     CBC:  Recent Labs Lab 12/09/13 1315  WBC 5.8  NEUTROABS 2.5  HGB 12.2  HCT 38.1  MCV 78.3*  PLT 426*   Studies:  No results found.   RADIOGRAPHIC STUDIES: No results found.  ASSESSMENT: Haley Suarez 38 y.o. female with a history of Microcytosis  Thrombocytosis   PLAN:   1. Microcytosis NOS.  --She has a mild iron deficiency. It is a common cause of microcytosis; however, her ferritin was not below 15. She could also has some chronic inflammation making it slightly elevated. I will provide  her ferrous sulfate 325 mg daily with vitamin c.  She denies a family history of blood related problems, i.e., thalassemia. She reports a history of uterine fibroids with removal of a few fibroids several years ago.   2. Thrombocytosis, mild and chronic.  --Likely reactive as evidence by normal wBC. Less likely to be a bone marrow problem.  Reactive thrombocytosis is also typical of iron deficiency anemia. We noticed decrease since her trial of iron.  If  improvement with iron for #1 continues, potentially her thrombocytosis may resolve.    3. GERD.  --She has a follow up with Dr. Penelope Coop with a recent normal endoscopy.   4. PCOS.  --She is following up with endocrinology.   5. Follow-up.  --We will repeat iron studies and CBC in three months and upon follow up in 6 months.   All questions were answered. The patient knows to call the clinic with any problems, questions or concerns. We can certainly see the patient much sooner if necessary.  I spent 10 minutes counseling the patient face to  face. The total time spent in the appointment was 15 minutes.    Concha Norway, MD 12/09/2013 2:09 PM

## 2013-12-12 ENCOUNTER — Telehealth: Payer: Self-pay | Admitting: Internal Medicine

## 2013-12-12 NOTE — Telephone Encounter (Signed)
, °

## 2014-03-12 ENCOUNTER — Telehealth: Payer: Self-pay

## 2014-03-12 ENCOUNTER — Other Ambulatory Visit (HOSPITAL_BASED_OUTPATIENT_CLINIC_OR_DEPARTMENT_OTHER): Payer: 59

## 2014-03-12 DIAGNOSIS — D473 Essential (hemorrhagic) thrombocythemia: Secondary | ICD-10-CM

## 2014-03-12 DIAGNOSIS — R718 Other abnormality of red blood cells: Secondary | ICD-10-CM

## 2014-03-12 DIAGNOSIS — D75839 Thrombocytosis, unspecified: Secondary | ICD-10-CM

## 2014-03-12 LAB — CBC WITH DIFFERENTIAL/PLATELET
BASO%: 0.3 % (ref 0.0–2.0)
Basophils Absolute: 0 10*3/uL (ref 0.0–0.1)
EOS ABS: 0.2 10*3/uL (ref 0.0–0.5)
EOS%: 2.6 % (ref 0.0–7.0)
HEMATOCRIT: 37.4 % (ref 34.8–46.6)
HGB: 12 g/dL (ref 11.6–15.9)
LYMPH%: 34.3 % (ref 14.0–49.7)
MCH: 25 pg — ABNORMAL LOW (ref 25.1–34.0)
MCHC: 32.1 g/dL (ref 31.5–36.0)
MCV: 78.1 fL — ABNORMAL LOW (ref 79.5–101.0)
MONO#: 0.8 10*3/uL (ref 0.1–0.9)
MONO%: 12.1 % (ref 0.0–14.0)
NEUT%: 50.7 % (ref 38.4–76.8)
NEUTROS ABS: 3.2 10*3/uL (ref 1.5–6.5)
PLATELETS: 493 10*3/uL — AB (ref 145–400)
RBC: 4.79 10*6/uL (ref 3.70–5.45)
RDW: 14.8 % — ABNORMAL HIGH (ref 11.2–14.5)
WBC: 6.3 10*3/uL (ref 3.9–10.3)
lymph#: 2.1 10*3/uL (ref 0.9–3.3)

## 2014-03-12 NOTE — Telephone Encounter (Signed)
LMOVM - lab result pt requested [phone].  Pt to call clinic if she has any questions.

## 2014-03-16 NOTE — Telephone Encounter (Signed)
Error

## 2014-05-29 ENCOUNTER — Other Ambulatory Visit (HOSPITAL_COMMUNITY)
Admission: RE | Admit: 2014-05-29 | Discharge: 2014-05-29 | Disposition: A | Discharge status: Home / Self Care | Payer: 59 | Source: Ambulatory Visit | Attending: Obstetrics and Gynecology | Admitting: Obstetrics and Gynecology

## 2014-05-29 ENCOUNTER — Other Ambulatory Visit: Payer: Self-pay | Admitting: Obstetrics and Gynecology

## 2014-05-29 DIAGNOSIS — Z1151 Encounter for screening for human papillomavirus (HPV): Secondary | ICD-10-CM | POA: Diagnosis present

## 2014-05-29 DIAGNOSIS — Z01411 Encounter for gynecological examination (general) (routine) with abnormal findings: Secondary | ICD-10-CM | POA: Diagnosis present

## 2014-05-29 DIAGNOSIS — N6320 Unspecified lump in the left breast, unspecified quadrant: Secondary | ICD-10-CM

## 2014-06-01 LAB — CYTOLOGY - PAP

## 2014-06-10 ENCOUNTER — Ambulatory Visit
Admission: RE | Admit: 2014-06-10 | Discharge: 2014-06-10 | Disposition: A | Discharge status: Home / Self Care | Payer: 59 | Source: Ambulatory Visit | Attending: Obstetrics and Gynecology | Admitting: Obstetrics and Gynecology

## 2014-06-10 ENCOUNTER — Other Ambulatory Visit: Payer: 59

## 2014-06-10 ENCOUNTER — Other Ambulatory Visit: Payer: Self-pay | Admitting: *Deleted

## 2014-06-10 ENCOUNTER — Other Ambulatory Visit: Payer: Self-pay | Admitting: Obstetrics and Gynecology

## 2014-06-10 DIAGNOSIS — N6320 Unspecified lump in the left breast, unspecified quadrant: Secondary | ICD-10-CM

## 2014-06-10 DIAGNOSIS — D473 Essential (hemorrhagic) thrombocythemia: Secondary | ICD-10-CM

## 2014-06-10 DIAGNOSIS — N6310 Unspecified lump in the right breast, unspecified quadrant: Secondary | ICD-10-CM

## 2014-06-10 DIAGNOSIS — D75839 Thrombocytosis, unspecified: Secondary | ICD-10-CM

## 2014-06-10 DIAGNOSIS — R718 Other abnormality of red blood cells: Secondary | ICD-10-CM

## 2014-06-11 ENCOUNTER — Ambulatory Visit (HOSPITAL_BASED_OUTPATIENT_CLINIC_OR_DEPARTMENT_OTHER): Payer: 59 | Admitting: Hematology

## 2014-06-11 ENCOUNTER — Encounter: Payer: Self-pay | Admitting: Hematology

## 2014-06-11 ENCOUNTER — Other Ambulatory Visit (HOSPITAL_BASED_OUTPATIENT_CLINIC_OR_DEPARTMENT_OTHER): Payer: 59

## 2014-06-11 ENCOUNTER — Telehealth: Payer: Self-pay | Admitting: Hematology

## 2014-06-11 VITALS — BP 116/63 | HR 93 | Temp 98.6°F | Resp 18 | Ht 60.0 in | Wt 190.7 lb

## 2014-06-11 DIAGNOSIS — D473 Essential (hemorrhagic) thrombocythemia: Secondary | ICD-10-CM | POA: Diagnosis not present

## 2014-06-11 DIAGNOSIS — R718 Other abnormality of red blood cells: Secondary | ICD-10-CM

## 2014-06-11 DIAGNOSIS — D75839 Thrombocytosis, unspecified: Secondary | ICD-10-CM

## 2014-06-11 DIAGNOSIS — R799 Abnormal finding of blood chemistry, unspecified: Secondary | ICD-10-CM | POA: Diagnosis not present

## 2014-06-11 DIAGNOSIS — E282 Polycystic ovarian syndrome: Secondary | ICD-10-CM

## 2014-06-11 DIAGNOSIS — K219 Gastro-esophageal reflux disease without esophagitis: Secondary | ICD-10-CM

## 2014-06-11 DIAGNOSIS — D509 Iron deficiency anemia, unspecified: Secondary | ICD-10-CM

## 2014-06-11 LAB — COMPREHENSIVE METABOLIC PANEL (CC13)
ALT: 17 U/L (ref 0–55)
AST: 21 U/L (ref 5–34)
Albumin: 3.8 g/dL (ref 3.5–5.0)
Alkaline Phosphatase: 79 U/L (ref 40–150)
Anion Gap: 8 mEq/L (ref 3–11)
BILIRUBIN TOTAL: 0.42 mg/dL (ref 0.20–1.20)
BUN: 12.4 mg/dL (ref 7.0–26.0)
CO2: 24 mEq/L (ref 22–29)
CREATININE: 0.8 mg/dL (ref 0.6–1.1)
Calcium: 9.2 mg/dL (ref 8.4–10.4)
Chloride: 107 mEq/L (ref 98–109)
Glucose: 82 mg/dl (ref 70–140)
Potassium: 4.6 mEq/L (ref 3.5–5.1)
Sodium: 139 mEq/L (ref 136–145)
Total Protein: 7.2 g/dL (ref 6.4–8.3)

## 2014-06-11 LAB — CBC WITH DIFFERENTIAL/PLATELET
BASO%: 0.3 % (ref 0.0–2.0)
Basophils Absolute: 0 10*3/uL (ref 0.0–0.1)
EOS ABS: 0.1 10*3/uL (ref 0.0–0.5)
EOS%: 1.3 % (ref 0.0–7.0)
HCT: 36.7 % (ref 34.8–46.6)
HGB: 11.7 g/dL (ref 11.6–15.9)
LYMPH%: 27.9 % (ref 14.0–49.7)
MCH: 25.4 pg (ref 25.1–34.0)
MCHC: 31.9 g/dL (ref 31.5–36.0)
MCV: 79.6 fL (ref 79.5–101.0)
MONO#: 0.6 10*3/uL (ref 0.1–0.9)
MONO%: 9.1 % (ref 0.0–14.0)
NEUT#: 3.9 10*3/uL (ref 1.5–6.5)
NEUT%: 61.4 % (ref 38.4–76.8)
Platelets: 438 10*3/uL — ABNORMAL HIGH (ref 145–400)
RBC: 4.61 10*6/uL (ref 3.70–5.45)
RDW: 14.5 % (ref 11.2–14.5)
WBC: 6.3 10*3/uL (ref 3.9–10.3)
lymph#: 1.8 10*3/uL (ref 0.9–3.3)

## 2014-06-11 NOTE — Telephone Encounter (Signed)
Lft msg for pt confirming labs/ov per 11/05 POF, mailed sch to pt..... KJ

## 2014-06-11 NOTE — Patient Instructions (Signed)
Take iron and vit c daily

## 2014-06-11 NOTE — Progress Notes (Signed)
[facility] HEMATOLOGY OFFICE PROGRESS NOTE DATE OF VISIT: 06/11/2014  REDMON,NOELLE, PA-C [address], [address] [place] [address]  DIAGNOSIS: Iron deficiency anemia - Plan: CBC with Differential, Ferritin, Iron and TIBC [facility]  Chief Complaint  Patient presents with  . Follow-up    CURRENT TREATMENT: Iron Once a day, Vitamin C 500 mg daily.   INTERVAL HISTORY:  Haley Suarez 38 y.o. female with a history of microcytosis, thrombocytosis. She was last seen by Dr Juliann Mule on 12/09/2013.    As previously noted on initial consultation, she reported five days prior to this visit, she felt lightheaded and reported to urgent care. Her BP was elevated at 160/90. She complained of fatigue, hair lost and tightness in her throat. She was told that she may have a thyroid but the thyroid work up was negative. She had not been having any fever or URI symptoms. Her CBC in urgent care revealed an elevated plt count of 517 with a normal WBC and hgb. Based on this elevation, she was concerned and requested a referral to hematology. Her CBC on 11/06/2012 revealed a WBC of 6.3; hgb of 12.9 with a low MCV of 78.4. (81 - 99) and Plts of 467. CBC on 11/01/2009 revealed a WBC of 5.4 hgb of 12.3 and MCV 51f 81.4 (which is normal) with plts of 442. On 11/23/2008, her wBC was 8.4; hgb of 12.0; MCV of 79.1; plt of 445.   Of note, she has a longstanding history of GERD and has been on medications for this since age 38. She reports a tingling sensation in her mouth and sometimes feels that her tongue is thick and swollen without any dyspnea doing these episodes. She reports a chronic cough. Her recent CXR was negative. Six months ago, she reports a allergic reaction to a vitamin pack from [facility]. This has now resolved. She has a history of polycystic ovarian syndrome managed by Dr. Landry Mellow. She denies a history of post showering pruritus. She does endorse insomnia. Her hair lost has occurred over the past 5  months. She denies any weight lost. During her visit with PA Redmon she was provided dexilant for her GERD. 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   She saw Dr. Penelope Coop who did an EGD which was normal.  She saw Dr. Christophe Louis of gynecology and she had an abnormal pap that was irregular and had a colposcopy.  She denies any hospitalizations or emergency room visits.  She does report allergy-like symptoms with coughing.    Her last Pap smear 05/29/14 shows the following pathology. She goes to [facility].     Patient feels better currently. She is exercising with a personal trainer and is also on a specific diet regimen. She has cut back on daily and fat products. She was not compliant with iron but she promised me she will take it once a day for at least try couple of times a week she is also told to take vitamin C 500 mg daily tablet.  Her labs from today indicate that there is an improvement in her platelet count and normalization of her MCV.      MEDICAL HISTORY: Past Medical History  Diagnosis Date  . GERD (gastroesophageal reflux disease)   . PCOS (polycystic ovarian syndrome)     INTERIM HISTORY: has HPV; POLYCYSTIC OVARIAN DISEASE; BRONCHITIS, CHRONIC; G E R D; COUGH; Thrombocytosis; and Microcytosis on  her problem list.    ALLERGIES:  has No Known Allergies.  MEDICATIONS: has a current medication list which includes the following prescription(s): cholecalciferol, dexlansoprazole, ferrous sulfate, omeprazole, prenatal multivitamin, promethazine, and promethazine.  SURGICAL HISTORY:  Past Surgical History  Procedure Laterality Date  . Removal of fibroids      REVIEW OF SYSTEMS:   Constitutional: Denies fevers, chills or abnormal weight loss Eyes: Denies blurriness of vision Ears, nose, mouth, throat, and face: Denies mucositis or sore throat Respiratory: Denies cough, dyspnea or  wheezes Cardiovascular: Denies palpitation, chest discomfort or lower extremity swelling Gastrointestinal:  Denies nausea, heartburn or change in bowel habits Skin: Denies abnormal skin rashes Lymphatics: Denies new lymphadenopathy or easy bruising Neurological:Denies numbness, tingling or new weaknesses Behavioral/Psych: Mood is stable, no new changes  All other systems were reviewed with the patient and are negative.  PHYSICAL EXAMINATION: ECOG PERFORMANCE STATUS: 0  Blood pressure 116/63, pulse 93, temperature 98.6 F (37 C), temperature source Oral, resp. rate 18, height 5' (1.524 m), weight 190 lb 11.2 oz (86.501 kg).  GENERAL:alert, no distress and comfortable; obese, well developed.  SKIN: skin color, texture, turgor are normal, no rashes or significant lesions EYES: normal, Conjunctiva are pink and non-injected, sclera clear OROPHARYNX:no exudate, no erythema and lips, buccal mucosa, and tongue normal  NECK: supple, thyroid normal size, non-tender, without nodularity LYMPH:  no palpable lymphadenopathy in the cervical, axillary or supraclavicular LUNGS: clear to auscultation with normal breathing effort, no wheezes or rhonchi HEART: regular rate & rhythm and no murmurs and no lower extremity edema ABDOMEN:abdomen soft, non-tender and normal bowel sounds Musculoskeletal:no cyanosis of digits and no clubbing  NEURO: alert & oriented x 3 with fluent speech, no focal motor/sensory deficits  Labs:  Lab Results  Component Value Date   WBC 6.3 06/11/2014   HGB 11.7 06/11/2014   HCT 36.7 06/11/2014   MCV 79.6 06/11/2014   PLT 438* 06/11/2014   NEUTROABS 3.9 06/11/2014      Chemistry      Component Value Date/Time   NA 140 10/09/2013 1043   K 3.9 10/09/2013 1043   CO2 26 10/09/2013 1043   BUN 7.1 10/09/2013 1043   CREATININE 0.8 10/09/2013 1043      Component Value Date/Time   CALCIUM 9.9 10/09/2013 1043   ALKPHOS 87 10/09/2013 1043   AST 28 10/09/2013 1043   ALT 21  10/09/2013 1043   BILITOT 0.48 10/09/2013 1043     CBC:  Recent Labs Lab 06/11/14 0935  WBC 6.3  NEUTROABS 3.9  HGB 11.7  HCT 36.7  MCV 79.6  PLT 438*   Studies:  Mm Digital Diagnostic Bilat  06/10/2014   CLINICAL DATA:  38 year old female with palpable lump in the upper outer left breast. 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  EXAM: DIGITAL DIAGNOSTIC  BILATERAL MAMMOGRAM WITH CAD  ULTRASOUND RIGHT BREAST  COMPARISON:  11/02/2009 mammogram and ultrasound.  ACR Breast Density Category b: There are scattered areas of fibroglandular density.  FINDINGS: Routine and spot compression views of both breasts are performed.  A circumscribed oval mass in the upper outer left breast containing a biopsy clip is unchanged and compatible with biopsy-proven fibroadenoma.  A partially circumscribed partially obscured oval mass within the upper inner right breast is identified.  No worrisome calcifications or distortion identified bilaterally.  Mammographic images were processed with CAD.  On physical exam, no palpable abnormalities are identified within the right breast.  Ultrasound is performed, showing a 1.2  x 0.4 x 1.1 cm circumscribed oval hypoechoic parallel mass at the 2 o'clock position of the right breast 4 cm from the nipple, likely a fibroadenoma.  IMPRESSION: 1.2 x 0.4 x 1.1 cm probable fibroadenoma within the upper inner right breast. We discussed management options including excision, ultrasound-guided core biopsy, and short term interval follow-up. Follow-up ultrasound is recommended at 6, 12,and 24 months to assess stability. The patient concurs with this plan.  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  RECOMMENDATION: Right breast ultrasound in 6 months.  I have discussed the findings and recommendations with the patient. Results were also provided in writing at the conclusion of the visit. If  applicable, a reminder letter will be sent to the patient regarding the next appointment.  BI-RADS CATEGORY  3: Probably benign.   Electronically Signed   By: Hassan Rowan M.D.   On: 06/10/2014 16:38   US Breast Ltd Uni Right Inc Axilla  06/10/2014   CLINICAL DATA:  38 year old female with palpable lump in the upper outer left breast. 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  EXAM: DIGITAL DIAGNOSTIC  BILATERAL MAMMOGRAM WITH CAD  ULTRASOUND RIGHT BREAST  COMPARISON:  11/02/2009 mammogram and ultrasound.  ACR Breast Density Category b: There are scattered areas of fibroglandular density.  FINDINGS: Routine and spot compression views of both breasts are performed.  A circumscribed oval mass in the upper outer left breast containing a biopsy clip is unchanged and compatible with biopsy-proven fibroadenoma.  A partially circumscribed partially obscured oval mass within the upper inner right breast is identified.  No worrisome calcifications or distortion identified bilaterally.  Mammographic images were processed with CAD.  On physical exam, no palpable abnormalities are identified within the right breast.  Ultrasound is performed, showing a 1.2 x 0.4 x 1.1 cm circumscribed oval hypoechoic parallel mass at the 2 o'clock position of the right breast 4 cm from the nipple, likely a fibroadenoma.  IMPRESSION: 1.2 x 0.4 x 1.1 cm probable fibroadenoma within the upper inner right breast. We discussed management options including excision, ultrasound-guided core biopsy, and short term interval follow-up. Follow-up ultrasound is recommended at 6, 12,and 24 months to assess stability. The patient concurs with this plan.  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  RECOMMENDATION: Right breast ultrasound in 6 months.  I have discussed the findings and recommendations with the patient. Results were also provided in writing at the  conclusion of the visit. If applicable, a reminder letter will be sent to the patient regarding the next appointment.  BI-RADS CATEGORY  3: Probably benign.   Electronically Signed   By: Hassan Rowan M.D.   On: 06/10/2014 16:38     RADIOGRAPHIC STUDIES: No results found.  ASSESSMENT: Ceri C Suarez 38 y.o. female with a history of Iron deficiency anemia - Plan: CBC with Differential, Ferritin, Iron and TIBC [facility]   PLAN:   1. Microcytosis NOS.  --She has a history of mild iron deficiency. It is a common cause of microcytosis; however, her ferritin was not below 15. She could also has some chronic inflammation making it slightly elevated. I will provide her ferrous sulfate 325 mg daily with vitamin c.  She denies a family history of blood related problems, i.e., thalassemia. She reports a history of uterine fibroids with removal of a few fibroids several years ago. Her MCV is normal and platelets are trending down.  Hemoglobin is stable at 11.7 grams.  2. Thrombocytosis, mild and chronic.  --Likely reactive as evidence by normal wBC. Less likely to be a bone marrow problem.  Reactive thrombocytosis is also typical of iron deficiency anemia. We noticed decrease since her trial of iron.  If  improvement with iron for #1 continues, potentially her thrombocytosis may resolve.    3. GERD.  --She has a follow up with Dr. Penelope Coop with a recent normal endoscopy.   4. PCOS.  --She is following up with endocrinology.   5. Follow-up.  --We will repeat iron studies and CBC in six months and upon follow up in 6 months.   All questions were answered. The patient knows to call the clinic with any problems, questions or concerns. We can certainly see the patient much sooner if necessary.  I spent 10 minutes counseling the patient face to face. The total time spent in the appointment was 15 minutes.    Bernadene Bell, MD Medical Hematologist/Oncologist [place] Pager: [phone] Office  No: [phone]

## 2014-12-09 ENCOUNTER — Other Ambulatory Visit: Payer: Self-pay | Admitting: *Deleted

## 2014-12-09 DIAGNOSIS — D75839 Thrombocytosis, unspecified: Secondary | ICD-10-CM

## 2014-12-09 DIAGNOSIS — R718 Other abnormality of red blood cells: Secondary | ICD-10-CM

## 2014-12-09 DIAGNOSIS — D473 Essential (hemorrhagic) thrombocythemia: Secondary | ICD-10-CM

## 2014-12-10 ENCOUNTER — Ambulatory Visit: Payer: 59 | Admitting: Hematology

## 2014-12-10 ENCOUNTER — Other Ambulatory Visit: Payer: Self-pay | Admitting: *Deleted

## 2014-12-10 ENCOUNTER — Other Ambulatory Visit: Payer: 59

## 2014-12-11 ENCOUNTER — Telehealth: Payer: Self-pay | Admitting: Hematology

## 2014-12-11 NOTE — Telephone Encounter (Signed)
Left message to confirm appointment for 05/18. Mailed calendar

## 2014-12-23 ENCOUNTER — Ambulatory Visit (HOSPITAL_BASED_OUTPATIENT_CLINIC_OR_DEPARTMENT_OTHER): Payer: 59 | Admitting: Hematology

## 2014-12-23 ENCOUNTER — Encounter: Payer: Self-pay | Admitting: Hematology

## 2014-12-23 ENCOUNTER — Telehealth: Payer: Self-pay | Admitting: Hematology

## 2014-12-23 ENCOUNTER — Other Ambulatory Visit (HOSPITAL_BASED_OUTPATIENT_CLINIC_OR_DEPARTMENT_OTHER): Payer: 59

## 2014-12-23 VITALS — BP 129/71 | HR 72 | Temp 98.0°F | Resp 18 | Ht 60.0 in | Wt 190.8 lb

## 2014-12-23 DIAGNOSIS — D473 Essential (hemorrhagic) thrombocythemia: Secondary | ICD-10-CM

## 2014-12-23 DIAGNOSIS — R718 Other abnormality of red blood cells: Secondary | ICD-10-CM

## 2014-12-23 DIAGNOSIS — D509 Iron deficiency anemia, unspecified: Secondary | ICD-10-CM

## 2014-12-23 DIAGNOSIS — R799 Abnormal finding of blood chemistry, unspecified: Secondary | ICD-10-CM | POA: Diagnosis not present

## 2014-12-23 DIAGNOSIS — D75839 Thrombocytosis, unspecified: Secondary | ICD-10-CM

## 2014-12-23 DIAGNOSIS — R7989 Other specified abnormal findings of blood chemistry: Secondary | ICD-10-CM | POA: Diagnosis not present

## 2014-12-23 DIAGNOSIS — D75838 Other thrombocytosis: Secondary | ICD-10-CM

## 2014-12-23 LAB — CBC WITH DIFFERENTIAL/PLATELET
BASO%: 0.4 % (ref 0.0–2.0)
BASOS ABS: 0 10*3/uL (ref 0.0–0.1)
EOS%: 1.5 % (ref 0.0–7.0)
Eosinophils Absolute: 0.1 10*3/uL (ref 0.0–0.5)
HEMATOCRIT: 36.2 % (ref 34.8–46.6)
HEMOGLOBIN: 11.5 g/dL — AB (ref 11.6–15.9)
LYMPH%: 42.9 % (ref 14.0–49.7)
MCH: 24.6 pg — AB (ref 25.1–34.0)
MCHC: 31.8 g/dL (ref 31.5–36.0)
MCV: 77.5 fL — ABNORMAL LOW (ref 79.5–101.0)
MONO#: 0.4 10*3/uL (ref 0.1–0.9)
MONO%: 9.1 % (ref 0.0–14.0)
NEUT#: 2.2 10*3/uL (ref 1.5–6.5)
NEUT%: 46.1 % (ref 38.4–76.8)
Platelets: 419 10*3/uL — ABNORMAL HIGH (ref 145–400)
RBC: 4.67 10*6/uL (ref 3.70–5.45)
RDW: 14.8 % — ABNORMAL HIGH (ref 11.2–14.5)
WBC: 4.8 10*3/uL (ref 3.9–10.3)
lymph#: 2.1 10*3/uL (ref 0.9–3.3)

## 2014-12-23 LAB — IRON AND TIBC CHCC
%SAT: 11 % — ABNORMAL LOW (ref 21–57)
Iron: 47 ug/dL (ref 41–142)
TIBC: 446 ug/dL — AB (ref 236–444)
UIBC: 399 ug/dL — ABNORMAL HIGH (ref 120–384)

## 2014-12-23 LAB — FERRITIN CHCC: FERRITIN: 10 ng/mL (ref 9–269)

## 2014-12-23 NOTE — Progress Notes (Signed)
[facility] H[facility]AT[facility] OFFICE PROGRESS NOTE DATE OF VISIT: 06/11/2014  Haley Suarez,NOELLE, PA-C [address]  [place] [address]  DIAGNOSIS: Reactive thrombocytosis  Iron deficiency anemia  Chief Complaint  Patient presents with  . Follow-up    iron def anemia    CURRENT TREATMENT: Iron Once a day, Vitamin C 500 mg daily.   INTERVAL HISTORY:  Haley Suarez 39 y.o. female with a history of microcytosis, thrombocytosis. She was last seen by Dr Lona Kettle on 06/11/2014.    History per initial consult: "As previously noted on initial consultation, she reported five days prior to this visit, she felt lightheaded and reported to urgent care. Her BP was elevated at 160/90. She complained of fatigue, hair lost and tightness in her throat. She was told that she may have a thyroid but the thyroid work up was negative. She had not been having any fever or URI symptoms. Her CBC in urgent care revealed an elevated plt count of 517 with a normal WBC and hgb. Based on this elevation, she was concerned and requested a referral to hematology. Her CBC on 11/06/2012 revealed a WBC of 6.3; hgb of 12.9 with a low MCV of 78.4. (81 - 99) and Plts of 467. CBC on 11/01/2009 revealed a WBC of 5.4 hgb of 12.3 and MCV 52f 81.4 (which is normal) with plts of 442. On 11/23/2008, her wBC was 8.4; hgb of 12.0; MCV of 79.1; plt of 445.   Of note, she has a longstanding history of GERD and has been on medications for this since age 56. She reports a tingling sensation in her mouth and sometimes feels that her tongue is thick and swollen without any dyspnea doing these episodes. She reports a chronic cough. Her recent CXR was negative. Six months ago, she reports a allergic reaction to a vitamin pack from [facility]. This has now resolved. She has a history of polycystic ovarian syndrome managed by Dr. Landry Mellow. She denies a history of post showering pruritus. She does endorse insomnia. Her hair lost has occurred  over the past 5 months. She denies any weight lost. During her visit with PA Haley Suarez she was provided dexilant for her GERD. 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   She saw Dr. Penelope Coop who did an EGD which was normal.  She saw Dr. Christophe Louis of gynecology and she had an abnormal pap that was irregular and had a colposcopy.  She denies any hospitalizations or emergency room visits.  She does report allergy-like symptoms with coughing."  She returns for follow up. She is doing well overall. She has stable mild fatigue, no chest pain, dyspnea or other complains.  She has normal period, not heavy, denies any other bleeding. She has not taking iron pill for over a year now.   Of note, she had family history of colon cancer (mother and maternal grandmother at age of 53's), and her mother also had ovarian cancer.    MEDICAL HISTORY: Past Medical History  Diagnosis Date  . GERD (gastroesophageal reflux disease)   . PCOS (polycystic ovarian syndrome)     INTERIM HISTORY: has HPV; POLYCYSTIC OVARIAN DISEASE; BRONCHITIS, CHRONIC; G E R D; COUGH; Reactive thrombocytosis; Microcytosis; and Iron deficiency anemia on her problem list.    ALLERGIES:  has No Known Allergies.  MEDICATIONS: has a current medication list which includes the following prescription(s): ferrous sulfate.  SURGICAL HISTORY:  Past Surgical  History  Procedure Laterality Date  . Removal of fibroids      REVIEW OF SYSTEMS:   Constitutional: Denies fevers, chills or abnormal weight loss Eyes: Denies blurriness of vision Ears, nose, mouth, throat, and face: Denies mucositis or sore throat Respiratory: Denies cough, dyspnea or wheezes Cardiovascular: Denies palpitation, chest discomfort or lower extremity swelling Gastrointestinal:  Denies nausea, heartburn or change in bowel habits Skin: Denies abnormal skin rashes Lymphatics: Denies new lymphadenopathy or easy  bruising Neurological:Denies numbness, tingling or new weaknesses Behavioral/Psych: Mood is stable, no new changes  All other systems were reviewed with the patient and are negative.  PHYSICAL EXAMINATION: ECOG PERFORMANCE STATUS: 0  Blood pressure 129/71, pulse 72, temperature 98 F (36.7 C), temperature source Oral, resp. rate 18, height 5' (1.524 m), weight 190 lb 12.8 oz (86.546 kg), SpO2 100 %.  GENERAL:alert, no distress and comfortable; obese, well developed.  SKIN: skin color, texture, turgor are normal, no rashes or significant lesions EYES: normal, Conjunctiva are pink and non-injected, sclera clear OROPHARYNX:no exudate, no erythema and lips, buccal mucosa, and tongue normal  NECK: supple, thyroid normal size, non-tender, without nodularity LYMPH:  no palpable lymphadenopathy in the cervical, axillary or supraclavicular LUNGS: clear to auscultation with normal breathing effort, no wheezes or rhonchi HEART: regular rate & rhythm and no murmurs and no lower extremity edema ABDOMEN:abdomen soft, non-tender and normal bowel sounds Musculoskeletal:no cyanosis of digits and no clubbing  NEURO: alert & oriented x 3 with fluent speech, no focal motor/sensory deficits  Labs:  CBC Latest Ref Rng 12/23/2014 06/11/2014 03/12/2014  WBC 3.9 - 10.3 10e3/uL 4.8 6.3 6.3  Hemoglobin 11.6 - 15.9 g/dL 11.5(L) 11.7 12.0  Hematocrit 34.8 - 46.6 % 36.2 36.7 37.4  Platelets 145 - 400 10e3/uL 419(H) 438(H) 493(H)    CMP Latest Ref Rng 06/11/2014 10/09/2013  Glucose 70 - 140 mg/dl 82 87  BUN 7.0 - 26.0 mg/dL 12.4 7.1  Creatinine 0.6 - 1.1 mg/dL 0.8 0.8  Sodium 136 - 145 mEq/L 139 140  Potassium 3.5 - 5.1 mEq/L 4.6 3.9  CO2 22 - 29 mEq/L 24 26  Calcium 8.4 - 10.4 mg/dL 9.2 9.9  Total Protein 6.4 - 8.3 g/dL 7.2 7.9  Total Bilirubin 0.20 - 1.20 mg/dL 0.42 0.48  Alkaline Phos 40 - 150 U/L 79 87  AST 5 - 34 U/L 21 28  ALT 0 - 55 U/L 17 21     RADIOGRAPHIC STUDIES: No results  found.  ASSESSMENT: Haley Suarez 39 y.o. female with a history of Reactive thrombocytosis  Iron deficiency anemia   1. Microcytic anemia, iron deficient anemia  -she has developed mild anemia, Hg 11.5g/dl today, MCV 77. Ferritin today is 10 (was 20 one year ago), elevated TIBC and low saturation, this is consistent with iron deficient anemia.  -etiology is not clear. Her menstrual period is not heavy, no overt bleeding. She had EGD before, no colonoscopy. I recommend her to follow up with her GI, and consider colonoscopy (she has family history of colon cancer)  -low iron absorption also possible, due to her GERD and PPI -I recommend her to restart oral iron 2-3 tab daily to replace iron.  -repeat CBC and iron study in 3 month, if no improvement, will consider iv iron    2.  Mild Thrombocytosis, reactive to #1 --Likely reactive to iron deficient anemia -no increase risk of thrombosis. No need aspirin  -monitor CBC    3. GERD.  --She has a follow up  with Dr. Penelope Coop with a recent normal endoscopy.   4. PCOS.  --She is following up with endocrinology.   5. Genetics -she has strong family history of colon and ovarian cancer, I recommend her mother to have genetic testing   5. Follow-up.  -3 month with repeat CBC and iron study one week before    All questions were answered. The patient knows to call the clinic with any problems, questions or concerns. We can certainly see the patient much sooner if necessary.  I spent 20 minutes counseling the patient face to face. The total time spent in the appointment was 25 minutes.   Truitt Merle  12/23/2014

## 2014-12-23 NOTE — Telephone Encounter (Signed)
Gave and prnted appt sched and avs fo rpt for Aug

## 2015-03-15 ENCOUNTER — Telehealth: Payer: Self-pay | Admitting: Hematology

## 2015-03-15 NOTE — Telephone Encounter (Signed)
returned call adn s.w. pt and r/s appt....pt ok and aware of new d.t

## 2015-03-17 ENCOUNTER — Other Ambulatory Visit: Payer: Self-pay

## 2015-03-24 ENCOUNTER — Ambulatory Visit: Payer: Self-pay | Admitting: Hematology

## 2015-04-01 ENCOUNTER — Other Ambulatory Visit: Payer: Self-pay | Admitting: *Deleted

## 2015-04-01 DIAGNOSIS — D75838 Other thrombocytosis: Secondary | ICD-10-CM

## 2015-04-01 DIAGNOSIS — D509 Iron deficiency anemia, unspecified: Secondary | ICD-10-CM

## 2015-04-01 DIAGNOSIS — R7989 Other specified abnormal findings of blood chemistry: Secondary | ICD-10-CM

## 2015-04-02 ENCOUNTER — Other Ambulatory Visit (HOSPITAL_BASED_OUTPATIENT_CLINIC_OR_DEPARTMENT_OTHER): Payer: 59

## 2015-04-02 DIAGNOSIS — D509 Iron deficiency anemia, unspecified: Secondary | ICD-10-CM

## 2015-04-02 DIAGNOSIS — R7989 Other specified abnormal findings of blood chemistry: Secondary | ICD-10-CM

## 2015-04-02 DIAGNOSIS — D473 Essential (hemorrhagic) thrombocythemia: Secondary | ICD-10-CM | POA: Diagnosis not present

## 2015-04-02 DIAGNOSIS — R799 Abnormal finding of blood chemistry, unspecified: Secondary | ICD-10-CM | POA: Diagnosis not present

## 2015-04-02 DIAGNOSIS — D75838 Other thrombocytosis: Secondary | ICD-10-CM

## 2015-04-02 LAB — IRON AND TIBC CHCC
%SAT: 8 % — ABNORMAL LOW (ref 21–57)
IRON: 34 ug/dL — AB (ref 41–142)
TIBC: 424 ug/dL (ref 236–444)
UIBC: 390 ug/dL — ABNORMAL HIGH (ref 120–384)

## 2015-04-02 LAB — CBC WITH DIFFERENTIAL/PLATELET
BASO%: 0.5 % (ref 0.0–2.0)
Basophils Absolute: 0 10*3/uL (ref 0.0–0.1)
EOS%: 2.3 % (ref 0.0–7.0)
Eosinophils Absolute: 0.1 10*3/uL (ref 0.0–0.5)
HCT: 33.7 % — ABNORMAL LOW (ref 34.8–46.6)
HGB: 11 g/dL — ABNORMAL LOW (ref 11.6–15.9)
LYMPH%: 30.9 % (ref 14.0–49.7)
MCH: 24.7 pg — ABNORMAL LOW (ref 25.1–34.0)
MCHC: 32.7 g/dL (ref 31.5–36.0)
MCV: 75.6 fL — ABNORMAL LOW (ref 79.5–101.0)
MONO#: 0.7 10*3/uL (ref 0.1–0.9)
MONO%: 11.9 % (ref 0.0–14.0)
NEUT#: 3.2 10*3/uL (ref 1.5–6.5)
NEUT%: 54.4 % (ref 38.4–76.8)
Platelets: 490 10*3/uL — ABNORMAL HIGH (ref 145–400)
RBC: 4.45 10*6/uL (ref 3.70–5.45)
RDW: 15.9 % — ABNORMAL HIGH (ref 11.2–14.5)
WBC: 6 10*3/uL (ref 3.9–10.3)
lymph#: 1.9 10*3/uL (ref 0.9–3.3)

## 2015-04-02 LAB — COMPREHENSIVE METABOLIC PANEL (CC13)
ALT: 16 U/L (ref 0–55)
AST: 22 U/L (ref 5–34)
Albumin: 3.6 g/dL (ref 3.5–5.0)
Alkaline Phosphatase: 72 U/L (ref 40–150)
Anion Gap: 6 mEq/L (ref 3–11)
BUN: 10.7 mg/dL (ref 7.0–26.0)
CO2: 27 meq/L (ref 22–29)
CREATININE: 0.7 mg/dL (ref 0.6–1.1)
Calcium: 8.8 mg/dL (ref 8.4–10.4)
Chloride: 107 mEq/L (ref 98–109)
EGFR: >90 mL/min/{1.73_m2} (ref >90)
Glucose: 95 mg/dl (ref 70–140)
POTASSIUM: 4.2 meq/L (ref 3.5–5.1)
Sodium: 140 mEq/L (ref 136–145)
Total Bilirubin: 0.29 mg/dL (ref 0.20–1.20)
Total Protein: 6.8 g/dL (ref 6.4–8.3)

## 2015-04-02 LAB — FERRITIN CHCC: Ferritin: 11 ng/ml (ref 9–269)

## 2015-04-09 ENCOUNTER — Encounter: Payer: Self-pay | Admitting: Hematology

## 2015-04-09 ENCOUNTER — Telehealth: Payer: Self-pay | Admitting: Hematology

## 2015-04-09 ENCOUNTER — Ambulatory Visit (HOSPITAL_BASED_OUTPATIENT_CLINIC_OR_DEPARTMENT_OTHER): Payer: 59 | Admitting: Hematology

## 2015-04-09 VITALS — BP 140/77 | HR 87 | Temp 98.9°F | Resp 18 | Ht 60.0 in | Wt 205.8 lb

## 2015-04-09 DIAGNOSIS — D473 Essential (hemorrhagic) thrombocythemia: Secondary | ICD-10-CM | POA: Diagnosis not present

## 2015-04-09 DIAGNOSIS — D509 Iron deficiency anemia, unspecified: Secondary | ICD-10-CM | POA: Diagnosis not present

## 2015-04-09 NOTE — Progress Notes (Signed)
[facility] HEMATOLOGY OFFICE PROGRESS NOTE DATE OF VISIT: 06/11/2014  REDMON,NOELLE, PA-C [address] [place] [address]  DIAGNOSIS: Iron deficiency anemia  Chief Complaint  Patient presents with  . Follow-up    reactive thrombocytosis    CURRENT TREATMENT: Iron Once a day, Vitamin C 500 mg daily.   INTERVAL HISTORY:  Haley Suarez 39 y.o. female with a history of microcytosis, thrombocytosis, here for follow-up.  History per initial consult: "As previously noted on initial consultation, she reported five days prior to this visit, she felt lightheaded and reported to urgent care. Her BP was elevated at 160/90. She complained of fatigue, hair lost and tightness in her throat. She was told that she may have a thyroid but the thyroid work up was negative. She had not been having any fever or URI symptoms. Her CBC in urgent care revealed an elevated plt count of 517 with a normal WBC and hgb. Based on this elevation, she was concerned and requested a referral to hematology. Her CBC on 11/06/2012 revealed a WBC of 6.3; hgb of 12.9 with a low MCV of 78.4. (81 - 99) and Plts of 467. CBC on 11/01/2009 revealed a WBC of 5.4 hgb of 12.3 and MCV 36f 81.4 (which is normal) with plts of 442. On 11/23/2008, her wBC was 8.4; hgb of 12.0; MCV of 79.1; plt of 445.   Of note, she has a longstanding history of GERD and has been on medications for this since age 51. She reports a tingling sensation in her mouth and sometimes feels that her tongue is thick and swollen without any dyspnea doing these episodes. She reports a chronic cough. Her recent CXR was negative. Six months ago, she reports a allergic reaction to a vitamin pack from [facility]. This has now resolved. She has a history of polycystic ovarian syndrome managed by Dr. Landry Mellow. She denies a history of post showering pruritus. She does endorse insomnia. Her hair lost has occurred over the past 5 months. She denies any weight  lost. During her visit with PA Redmon she was provided dexilant for her GERD. 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   She saw Dr. Penelope Coop who did an EGD which was normal.  She saw Dr. Christophe Louis of gynecology and she had an abnormal pap that was irregular and had a colposcopy.  She denies any hospitalizations or emergency room visits.  She does report allergy-like symptoms with coughing."  She returns for follow up. She is doing well overall. She has been taking calf for her sick mother in the past few months, and has not contacted her gastroenterologist for repeated colonoscopy yet. She took iron pills for 2 weeks after she saw me last time, and has not been taking it since then.she has mild fatigue, otherwise denies any chest pain, dyspnea, or other symptoms. She denies any significant signs of bleeding. Her menstrual period is normal, not heavy.  MEDICAL HISTORY: Past Medical History  Diagnosis Date  . GERD (gastroesophageal reflux disease)   . PCOS (polycystic ovarian syndrome)     INTERIM HISTORY: has HPV; POLYCYSTIC OVARIAN DISEASE; BRONCHITIS, CHRONIC; G E R D; COUGH; Reactive thrombocytosis; Microcytosis; and Iron deficiency anemia on her problem list.    ALLERGIES:  is allergic to entex lq.  MEDICATIONS: has a current medication list which includes the following prescription(s): omeprazole and ferrous sulfate.  SURGICAL HISTORY:  Past Surgical  History  Procedure Laterality Date  . Removal of fibroids      REVIEW OF SYSTEMS:   Constitutional: Denies fevers, chills or abnormal weight loss Eyes: Denies blurriness of vision Ears, nose, mouth, throat, and face: Denies mucositis or sore throat Respiratory: Denies cough, dyspnea or wheezes Cardiovascular: Denies palpitation, chest discomfort or lower extremity swelling Gastrointestinal:  Denies nausea, heartburn or change in bowel habits Skin: Denies abnormal skin  rashes Lymphatics: Denies new lymphadenopathy or easy bruising Neurological:Denies numbness, tingling or new weaknesses Behavioral/Psych: Mood is stable, no new changes  All other systems were reviewed with the patient and are negative.  PHYSICAL EXAMINATION: ECOG PERFORMANCE STATUS: 0  Blood pressure 140/77, pulse 87, temperature 98.9 F (37.2 C), temperature source Oral, resp. rate 18, height 5' (1.524 m), weight 205 lb 12.8 oz (93.35 kg), SpO2 100 %.  GENERAL:alert, no distress and comfortable; obese, well developed.  SKIN: skin color, texture, turgor are normal, no rashes or significant lesions EYES: normal, Conjunctiva are pink and non-injected, sclera clear OROPHARYNX:no exudate, no erythema and lips, buccal mucosa, and tongue normal  NECK: supple, thyroid normal size, non-tender, without nodularity LYMPH:  no palpable lymphadenopathy in the cervical, axillary or supraclavicular LUNGS: clear to auscultation with normal breathing effort, no wheezes or rhonchi HEART: regular rate & rhythm and no murmurs and no lower extremity edema ABDOMEN:abdomen soft, non-tender and normal bowel sounds Musculoskeletal:no cyanosis of digits and no clubbing  NEURO: alert & oriented x 3 with fluent speech, no focal motor/sensory deficits  Labs:  CBC Latest Ref Rng 04/02/2015 12/23/2014 06/11/2014  WBC 3.9 - 10.3 10e3/uL 6.0 4.8 6.3  Hemoglobin 11.6 - 15.9 g/dL 11.0(L) 11.5(L) 11.7  Hematocrit 34.8 - 46.6 % 33.7(L) 36.2 36.7  Platelets 145 - 400 10e3/uL 490(H) 419(H) 438(H)    CMP Latest Ref Rng 04/02/2015 06/11/2014 10/09/2013  Glucose 70 - 140 mg/dl 95 82 87  BUN 7.0 - 26.0 mg/dL 10.7 12.4 7.1  Creatinine 0.6 - 1.1 mg/dL 0.7 0.8 0.8  Sodium 136 - 145 mEq/L 140 139 140  Potassium 3.5 - 5.1 mEq/L 4.2 4.6 3.9  CO2 22 - 29 mEq/L 27 24 26   Calcium 8.4 - 10.4 mg/dL 8.8 9.2 9.9  Total Protein 6.4 - 8.3 g/dL 6.8 7.2 7.9  Total Bilirubin 0.20 - 1.20 mg/dL 0.29 0.42 0.48  Alkaline Phos 40 - 150 U/L 72 79  87  AST 5 - 34 U/L 22 21 28   ALT 0 - 55 U/L 16 17 21    Results for LARIZA, COTHRON (MRN [id number]) as of 04/09/2015 10:42  Ref. Range 12/23/2014 09:42 04/02/2015 09:39  Iron Latest Ref Range: 41-142 ug/dL 47 34 (L)  UIBC Latest Ref Range: 120-384 ug/dL 399 (H) 390 (H)  TIBC Latest Ref Range: 236-444 ug/dL 446 (H) 424  %SAT Latest Ref Range: 21-57 % 11 (L) 8 (L)  Ferritin Latest Ref Range: 9-269 ng/ml 10 11      RADIOGRAPHIC STUDIES: No results found.  ASSESSMENT: Haley Suarez 39 y.o. female with a history of Iron deficiency anemia   1. Microcytic anemia, iron deficient anemia  -she is noncompliant with her oral iron, she only took 2 weeks since her last visit. I'll level remains below.  Her anemia is I worse, hemoglobin 11 today. -She agrees to restart iron pill, 2 tablets a day, with heart issues or vitamin C. I reinforced the importance of compliance.  -etiology is not clear. Her menstrual period is not heavy, no overt bleeding. She had  EGD before, no colonoscopy. I recommend her to follow up with her GI, and consider colonoscopy (she has family history of colon cancer), she was scheduled in the next few weeks. -low iron absorption also possible, due to her GERD and PPI -repeat CBC and iron study in 3 month.     2.  Mild Thrombocytosis, reactive to #1 --Likely reactive to iron deficient anemia -no increase risk of thrombosis. No need aspirin  -monitor CBC    3. GERD.  --She has a follow up with Dr. Penelope Coop with a recent normal endoscopy.   4. PCOS.  --She is following up with endocrinology.   5. Genetics -she has strong family history of colon and ovarian cancer, I recommend her mother to have genetic testing, she will think about it   5. Follow-up.  -3 month with repeat CBC and iron study one week before    All questions were answered. The patient knows to call the clinic with any problems, questions or concerns. We can certainly see the patient much sooner if  necessary.  I spent 20 minutes counseling the patient face to face. The total time spent in the appointment was 25 minutes.   Truitt Merle  04/09/2015

## 2015-04-09 NOTE — Telephone Encounter (Signed)
Gave adn printed appt sched and avs for pt for DEC  °

## 2015-06-16 ENCOUNTER — Other Ambulatory Visit: Payer: Self-pay | Admitting: Obstetrics and Gynecology

## 2015-06-16 DIAGNOSIS — N644 Mastodynia: Secondary | ICD-10-CM

## 2015-06-16 DIAGNOSIS — N6310 Unspecified lump in the right breast, unspecified quadrant: Secondary | ICD-10-CM

## 2015-06-22 ENCOUNTER — Ambulatory Visit
Admission: RE | Admit: 2015-06-22 | Discharge: 2015-06-22 | Disposition: A | Discharge status: Home / Self Care | Payer: 59 | Source: Ambulatory Visit | Attending: Obstetrics and Gynecology | Admitting: Obstetrics and Gynecology

## 2015-06-22 ENCOUNTER — Other Ambulatory Visit: Payer: Self-pay | Admitting: Obstetrics and Gynecology

## 2015-06-22 ENCOUNTER — Other Ambulatory Visit (HOSPITAL_COMMUNITY)
Admission: RE | Admit: 2015-06-22 | Discharge: 2015-06-22 | Disposition: A | Discharge status: Home / Self Care | Payer: 59 | Source: Ambulatory Visit | Attending: Obstetrics and Gynecology | Admitting: Obstetrics and Gynecology

## 2015-06-22 DIAGNOSIS — N6310 Unspecified lump in the right breast, unspecified quadrant: Secondary | ICD-10-CM

## 2015-06-22 DIAGNOSIS — Z01411 Encounter for gynecological examination (general) (routine) with abnormal findings: Secondary | ICD-10-CM | POA: Diagnosis present

## 2015-06-22 DIAGNOSIS — Z1151 Encounter for screening for human papillomavirus (HPV): Secondary | ICD-10-CM | POA: Insufficient documentation

## 2015-06-22 DIAGNOSIS — N644 Mastodynia: Secondary | ICD-10-CM

## 2015-06-22 DIAGNOSIS — Z113 Encounter for screening for infections with a predominantly sexual mode of transmission: Secondary | ICD-10-CM | POA: Diagnosis present

## 2015-06-23 LAB — CYTOLOGY - PAP

## 2015-07-08 ENCOUNTER — Other Ambulatory Visit: Payer: Self-pay | Admitting: *Deleted

## 2015-07-08 DIAGNOSIS — D509 Iron deficiency anemia, unspecified: Secondary | ICD-10-CM

## 2015-07-09 ENCOUNTER — Other Ambulatory Visit: Payer: 59

## 2015-07-09 ENCOUNTER — Other Ambulatory Visit (HOSPITAL_BASED_OUTPATIENT_CLINIC_OR_DEPARTMENT_OTHER): Payer: 59

## 2015-07-09 DIAGNOSIS — D509 Iron deficiency anemia, unspecified: Secondary | ICD-10-CM

## 2015-07-09 DIAGNOSIS — D473 Essential (hemorrhagic) thrombocythemia: Secondary | ICD-10-CM

## 2015-07-09 LAB — CBC WITH DIFFERENTIAL/PLATELET
BASO%: 0.7 % (ref 0.0–2.0)
Basophils Absolute: 0 10*3/uL (ref 0.0–0.1)
EOS%: 2.6 % (ref 0.0–7.0)
Eosinophils Absolute: 0.1 10*3/uL (ref 0.0–0.5)
HCT: 35.9 % (ref 34.8–46.6)
HGB: 11.7 g/dL (ref 11.6–15.9)
LYMPH%: 31.6 % (ref 14.0–49.7)
MCH: 25.5 pg (ref 25.1–34.0)
MCHC: 32.8 g/dL (ref 31.5–36.0)
MCV: 77.9 fL — ABNORMAL LOW (ref 79.5–101.0)
MONO#: 0.6 10*3/uL (ref 0.1–0.9)
MONO%: 11.7 % (ref 0.0–14.0)
NEUT%: 53.4 % (ref 38.4–76.8)
NEUTROS ABS: 2.8 10*3/uL (ref 1.5–6.5)
PLATELETS: 443 10*3/uL — AB (ref 145–400)
RBC: 4.61 10*6/uL (ref 3.70–5.45)
RDW: 16.7 % — ABNORMAL HIGH (ref 11.2–14.5)
WBC: 5.1 10*3/uL (ref 3.9–10.3)
lymph#: 1.6 10*3/uL (ref 0.9–3.3)

## 2015-07-09 LAB — IRON AND TIBC
%SAT: 11 % — AB (ref 21–57)
IRON: 44 ug/dL (ref 41–142)
TIBC: 406 ug/dL (ref 236–444)
UIBC: 362 ug/dL (ref 120–384)

## 2015-07-09 LAB — COMPREHENSIVE METABOLIC PANEL
ALT: 20 U/L (ref 0–55)
ANION GAP: 9 meq/L (ref 3–11)
AST: 28 U/L (ref 5–34)
Albumin: 3.7 g/dL (ref 3.5–5.0)
Alkaline Phosphatase: 71 U/L (ref 40–150)
BUN: 11.4 mg/dL (ref 7.0–26.0)
CHLORIDE: 108 meq/L (ref 98–109)
CO2: 23 meq/L (ref 22–29)
CREATININE: 0.8 mg/dL (ref 0.6–1.1)
Calcium: 8.9 mg/dL (ref 8.4–10.4)
EGFR: >90 mL/min/{1.73_m2} (ref >90)
GLUCOSE: 90 mg/dL (ref 70–140)
Potassium: 4.2 mEq/L (ref 3.5–5.1)
SODIUM: 139 meq/L (ref 136–145)
TOTAL PROTEIN: 7.4 g/dL (ref 6.4–8.3)
Total Bilirubin: <0.3 mg/dL (ref 0.20–1.20)

## 2015-07-09 LAB — FERRITIN: Ferritin: 12 ng/ml (ref 9–269)

## 2015-07-16 ENCOUNTER — Encounter: Payer: Self-pay | Admitting: Hematology

## 2015-07-16 ENCOUNTER — Encounter: Payer: 59 | Admitting: Hematology

## 2015-07-16 NOTE — Progress Notes (Signed)
No show  This encounter was created in error - please disregard.

## 2015-07-19 ENCOUNTER — Telehealth: Payer: Self-pay | Admitting: Hematology

## 2015-07-19 NOTE — Telephone Encounter (Signed)
cld pt to r/s appt-left message for pt to call & r/s if willing to come

## 2016-06-26 ENCOUNTER — Other Ambulatory Visit: Payer: Self-pay | Admitting: Obstetrics and Gynecology

## 2016-06-26 ENCOUNTER — Other Ambulatory Visit (HOSPITAL_COMMUNITY)
Admission: RE | Admit: 2016-06-26 | Discharge: 2016-06-26 | Disposition: A | Discharge status: Home / Self Care | Payer: 59 | Source: Ambulatory Visit | Attending: Obstetrics and Gynecology | Admitting: Obstetrics and Gynecology

## 2016-06-26 DIAGNOSIS — Z01411 Encounter for gynecological examination (general) (routine) with abnormal findings: Secondary | ICD-10-CM | POA: Insufficient documentation

## 2016-06-30 LAB — CYTOLOGY - PAP: Diagnosis: NEGATIVE

## 2016-08-07 DIAGNOSIS — I499 Cardiac arrhythmia, unspecified: Secondary | ICD-10-CM

## 2016-08-07 HISTORY — DX: Cardiac arrhythmia, unspecified: I49.9

## 2016-10-03 ENCOUNTER — Encounter (HOSPITAL_COMMUNITY): Payer: Self-pay

## 2016-10-03 ENCOUNTER — Emergency Department (HOSPITAL_COMMUNITY): Payer: Commercial Managed Care - HMO

## 2016-10-03 ENCOUNTER — Emergency Department (HOSPITAL_COMMUNITY)
Admission: EM | Admit: 2016-10-03 | Discharge: 2016-10-03 | Disposition: A | Discharge status: Home / Self Care | Payer: Commercial Managed Care - HMO | Attending: Emergency Medicine | Admitting: Emergency Medicine

## 2016-10-03 DIAGNOSIS — R079 Chest pain, unspecified: Secondary | ICD-10-CM

## 2016-10-03 DIAGNOSIS — R071 Chest pain on breathing: Secondary | ICD-10-CM | POA: Diagnosis present

## 2016-10-03 DIAGNOSIS — R0602 Shortness of breath: Secondary | ICD-10-CM

## 2016-10-03 DIAGNOSIS — Z79899 Other long term (current) drug therapy: Secondary | ICD-10-CM | POA: Insufficient documentation

## 2016-10-03 LAB — COMPREHENSIVE METABOLIC PANEL
ALBUMIN: 4.4 g/dL (ref 3.5–5.0)
ALK PHOS: 72 U/L (ref 38–126)
ALT: 23 U/L (ref 14–54)
AST: 29 U/L (ref 15–41)
Anion gap: 8 (ref 5–15)
BUN: <5 mg/dL — ABNORMAL LOW (ref 6–20)
CALCIUM: 9.3 mg/dL (ref 8.9–10.3)
CHLORIDE: 105 mmol/L (ref 101–111)
CO2: 25 mmol/L (ref 22–32)
CREATININE: 0.61 mg/dL (ref 0.44–1.00)
GFR calc Af Amer: >60 mL/min (ref >60)
GFR calc non Af Amer: >60 mL/min (ref >60)
GLUCOSE: 83 mg/dL (ref 65–99)
Potassium: 3.7 mmol/L (ref 3.5–5.1)
SODIUM: 138 mmol/L (ref 135–145)
Total Bilirubin: 0.6 mg/dL (ref 0.3–1.2)
Total Protein: 7.5 g/dL (ref 6.5–8.1)

## 2016-10-03 LAB — I-STAT CHEM 8, ED
BUN: <3 mg/dL — ABNORMAL LOW (ref 6–20)
CHLORIDE: 102 mmol/L (ref 101–111)
Calcium, Ion: 1.12 mmol/L — ABNORMAL LOW (ref 1.15–1.40)
Creatinine, Ser: 0.6 mg/dL (ref 0.44–1.00)
Glucose, Bld: 84 mg/dL (ref 65–99)
HCT: 38 % (ref 36.0–46.0)
HEMOGLOBIN: 12.9 g/dL (ref 12.0–15.0)
Potassium: 3.7 mmol/L (ref 3.5–5.1)
SODIUM: 141 mmol/L (ref 135–145)
TCO2: 26 mmol/L (ref 0–100)

## 2016-10-03 LAB — CBC WITH DIFFERENTIAL/PLATELET
BASOS ABS: 0 10*3/uL (ref 0.0–0.1)
BASOS PCT: 0 %
EOS ABS: 0.1 10*3/uL (ref 0.0–0.7)
Eosinophils Relative: 2 %
HCT: 37 % (ref 36.0–46.0)
HEMOGLOBIN: 12 g/dL (ref 12.0–15.0)
Lymphocytes Relative: 31 %
Lymphs Abs: 2.1 10*3/uL (ref 0.7–4.0)
MCH: 24.8 pg — ABNORMAL LOW (ref 26.0–34.0)
MCHC: 32.4 g/dL (ref 30.0–36.0)
MCV: 76.4 fL — ABNORMAL LOW (ref 78.0–100.0)
Monocytes Absolute: 0.9 10*3/uL (ref 0.1–1.0)
Monocytes Relative: 13 %
NEUTROS PCT: 54 %
Neutro Abs: 3.7 10*3/uL (ref 1.7–7.7)
Platelets: 535 10*3/uL — ABNORMAL HIGH (ref 150–400)
RBC: 4.84 MIL/uL (ref 3.87–5.11)
RDW: 13.9 % (ref 11.5–15.5)
WBC: 6.8 10*3/uL (ref 4.0–10.5)

## 2016-10-03 MED ORDER — SODIUM CHLORIDE 0.9 % IV BOLUS (SEPSIS)
1000.0000 mL | Freq: Once | INTRAVENOUS | Status: AC
  Administered 2016-10-03: 1000 mL via INTRAVENOUS

## 2016-10-03 MED ORDER — IOPAMIDOL (ISOVUE-370) INJECTION 76%
INTRAVENOUS | Status: AC
  Administered 2016-10-03: 100 mL via INTRAVENOUS
  Filled 2016-10-03: qty 100

## 2016-10-03 MED ORDER — IOPAMIDOL (ISOVUE-370) INJECTION 76%
100.0000 mL | Freq: Once | INTRAVENOUS | Status: AC | PRN
  Administered 2016-10-03: 100 mL via INTRAVENOUS

## 2016-10-03 MED ORDER — KETOROLAC TROMETHAMINE 30 MG/ML IJ SOLN
30.0000 mg | Freq: Once | INTRAMUSCULAR | Status: AC
  Administered 2016-10-03: 30 mg via INTRAVENOUS
  Filled 2016-10-03: qty 1

## 2016-10-03 NOTE — ED Provider Notes (Signed)
[facility] DEPT Provider Note   CSN: [id number] Arrival date & time: 10/03/16  0516     History   Chief Complaint Chief Complaint  Patient presents with  . Shortness of Breath    HPI Haley Suarez is a 41 y.o. female no sig PMH, here with R sided CP and SOB for the past 2 days.  Patient states the pain is sharp and starts under her right breast.  It radiates up into her chest, R shoulder and R back.  She denies having this pain in the past. She took advil without any relief.  Pain is worse with deep inspiration.  She denies any recent long distance travel, surgeries, or hormone use. No history of blood clots in the past.  No cardiac or pulmonary history. There are no further complaints.  10 Systems reviewed and are negative for acute change except as noted in the HPI.   HPI  Past Medical History:  Diagnosis Date  . GERD (gastroesophageal reflux disease)   . PCOS (polycystic ovarian syndrome)     Patient Active Problem List   Diagnosis Date Noted  . Iron deficiency anemia 12/23/2014  . Reactive thrombocytosis 10/09/2013  . Microcytosis 10/09/2013  . HPV 05/15/2008  . POLYCYSTIC OVARIAN DISEASE 05/15/2008  . BRONCHITIS, CHRONIC 05/15/2008  . G E R D 05/15/2008  . COUGH 05/15/2008    Past Surgical History:  Procedure Laterality Date  . removal of fibroids      OB History    No data available       Home Medications    Prior to Admission medications   Medication Sig Start Date End Date Taking? Authorizing Provider  Dexlansoprazole 30 MG capsule Take 30 mg by mouth daily as needed.   Yes Historical Provider, MD  Multiple Vitamin (MULTIVITAMIN WITH MINERALS) TABS tablet Take 3 tablets by mouth daily.   Yes Historical Provider, MD  ferrous sulfate 325 (65 FE) MG EC tablet Take 1 tablet (325 mg total) by mouth daily with breakfast. Patient not taking: Reported on 04/09/2015 12/09/13   Concha Norway, MD    Family History Family History  Problem Relation Age of  Onset  . GER disease Father   . Hepatitis C Mother   . Fibromyalgia Mother   . Ovarian cancer Mother     s/p TA  . Cancer Mother   . Colon cancer Mother 9  . Cancer Maternal Grandmother     Colorectal cancer    Social History Social History  Substance Use Topics  . Smoking status: Never Smoker  . Smokeless tobacco: Never Used  . Alcohol use No     Allergies   Entex lq [phenylephrine-guaifenesin]   Review of Systems Review of Systems   Physical Exam Updated Vital Signs BP 127/81   Pulse 74   Temp 98 F (36.7 C) (Oral)   Resp 21   LMP 09/12/2016   SpO2 97%   Physical Exam  Constitutional: She is oriented to person, place, and time. She appears well-developed and well-nourished. No distress.  HENT:  Head: Normocephalic and atraumatic.  Nose: Nose normal.  Mouth/Throat: Oropharynx is clear and moist. No oropharyngeal exudate.  Eyes: Conjunctivae and EOM are normal. Pupils are equal, round, and reactive to light. No scleral icterus.  Neck: Normal range of motion. Neck supple. No JVD present. No tracheal deviation present. No thyromegaly present.  Cardiovascular: Normal rate, regular rhythm and normal heart sounds.  Exam reveals no gallop and no friction rub.  No murmur heard. Pulmonary/Chest: Effort normal and breath sounds normal. No respiratory distress. She has no wheezes. She exhibits no tenderness.  Abdominal: Soft. Bowel sounds are normal. She exhibits no distension and no mass. There is no tenderness. There is no rebound and no guarding.  Musculoskeletal: Normal range of motion. She exhibits no edema or tenderness.  Lymphadenopathy:    She has no cervical adenopathy.  Neurological: She is alert and oriented to person, place, and time. No cranial nerve deficit. She exhibits normal muscle tone.  Skin: Skin is warm and dry. No rash noted. No erythema. No pallor.  Nursing note and vitals reviewed.    ED Treatments / Results  Labs (all labs ordered are  listed, but only abnormal results are displayed) Labs Reviewed  CBC WITH DIFFERENTIAL/PLATELET - Abnormal; Notable for the following:       Result Value   MCV 76.4 (*)    MCH 24.8 (*)    Platelets 535 (*)    All other components within normal limits  COMPREHENSIVE METABOLIC PANEL - Abnormal; Notable for the following:    BUN <5 (*)    All other components within normal limits  I-STAT CHEM 8, ED - Abnormal; Notable for the following:    BUN <3 (*)    Calcium, Ion 1.12 (*)    All other components within normal limits    EKG  EKG Interpretation  Date/Time:  Tuesday October 03 2016 05:50:41 EST Ventricular Rate:  81 PR Interval:    QRS Duration: 89 QT Interval:  361 QTC Calculation: 419 R Axis:   35 Text Interpretation:  Sinus rhythm No old tracing to compare Confirmed by Glynn Octave [phone]) on 10/03/2016 6:07:06 AM Also confirmed by Glynn Octave [phone]), editor Lorenda Cahill CT, Leda Gauze [phone])  on 10/03/2016 8:04:34 AM       Radiology Ct Angio Chest Pe W Or Wo Contrast  Result Date: 10/03/2016 CLINICAL DATA:  Acute onset of right-sided chest pain, radiating to the right shoulder. Difficulty breathing. Initial encounter. EXAM: CT ANGIOGRAPHY CHEST WITH CONTRAST TECHNIQUE: Multidetector CT imaging of the chest was performed using the standard protocol during bolus administration of intravenous contrast. Multiplanar CT image reconstructions and MIPs were obtained to evaluate the vascular anatomy. CONTRAST:  100 mL of Isovue 370 IV contrast COMPARISON:  Chest radiograph performed 09/26/2013 FINDINGS: Cardiovascular:  There is no evidence of pulmonary embolus. The heart is borderline normal in size. The thoracic aorta is unremarkable. No calcific atherosclerotic disease is seen. The great vessels are grossly unremarkable. Mediastinum/Nodes: The mediastinum is unremarkable in appearance. No mediastinal lymphadenopathy is seen. No pericardial effusion is identified. The visualized  portions of the thyroid gland are unremarkable. No axillary lymphadenopathy is seen. Lungs/Pleura: Trace right-sided pleural fluid is noted. Mild right basilar atelectasis is seen. No pneumothorax is identified. No masses are seen. Upper Abdomen: The visualized portions of the liver and spleen are grossly unremarkable. Musculoskeletal: No acute osseous abnormalities are identified. The visualized musculature is unremarkable in appearance. Review of the MIP images confirms the above findings. IMPRESSION: 1. No evidence pulmonary embolus. 2. Trace right-sided pleural fluid. Mild right basilar atelectasis noted. Electronically Signed   By: Garald Balding M.D.   On: 10/03/2016 06:45    Procedures Procedures (including critical care time)  Medications Ordered in ED Medications  sodium chloride 0.9 % bolus 1,000 mL (0 mLs Intravenous Stopped 10/03/16 0726)  ketorolac (TORADOL) 30 MG/ML injection 30 mg (30 mg Intravenous Given 10/03/16 0622)  iopamidol (ISOVUE-370)  76 % injection 100 mL (100 mLs Intravenous Contrast Given 10/03/16 0626)     Initial Impression / Assessment and Plan / ED Course  I have reviewed the triage vital signs and the nursing notes.  Pertinent labs & imaging results that were available during my care of the patient were reviewed by me and considered in my medical decision making (see chart for details).    Patient presents to the ED for CP and SOB.  History is concerning for possible PE given the nature of the pain. Will obtain CT for evaluation. She was given toradol for pain control. EKG is not concerning. Will continue to monitor.  6:30 AM Labs and EKG are unremarkable.  CT is still pending.  CT neg for PE or cause of pain.  She continues to appear quite well and in NAD with normal VS.  Advised to fu with PCP within 3 days for continued evaluation. She demonstrates good understanding of the plan. Patient is safe for DC.   Final Clinical Impressions(s) / ED Diagnoses    Final diagnoses:  Nonspecific chest pain  SOB (shortness of breath)    New Prescriptions Discharge Medication List as of 10/03/2016  6:51 AM       Everlene Balls, MD 10/03/16 1606

## 2016-10-03 NOTE — ED Notes (Signed)
Discharge instructions and follow up care reviewed with patient. Patient verbalized understanding. 

## 2016-10-03 NOTE — ED Triage Notes (Signed)
Pt complains of trouble breathing especially when lying down on the right side, the pain is under her breat radiating to her shoulder Pt has not been sick and denies injury

## 2017-10-12 ENCOUNTER — Other Ambulatory Visit: Payer: Self-pay | Admitting: Nurse Practitioner

## 2017-10-12 DIAGNOSIS — Z1231 Encounter for screening mammogram for malignant neoplasm of breast: Secondary | ICD-10-CM

## 2017-11-06 ENCOUNTER — Ambulatory Visit: Payer: 59

## 2017-11-06 ENCOUNTER — Ambulatory Visit
Admission: RE | Admit: 2017-11-06 | Discharge: 2017-11-06 | Disposition: A | Discharge status: Home / Self Care | Payer: 59 | Source: Ambulatory Visit | Attending: Nurse Practitioner | Admitting: Nurse Practitioner

## 2017-11-06 DIAGNOSIS — Z1231 Encounter for screening mammogram for malignant neoplasm of breast: Secondary | ICD-10-CM

## 2018-01-03 ENCOUNTER — Other Ambulatory Visit: Payer: Self-pay | Admitting: Obstetrics and Gynecology

## 2018-01-03 ENCOUNTER — Other Ambulatory Visit (HOSPITAL_COMMUNITY)
Admission: RE | Admit: 2018-01-03 | Discharge: 2018-01-03 | Disposition: A | Discharge status: Home / Self Care | Payer: 59 | Source: Ambulatory Visit | Attending: Obstetrics and Gynecology | Admitting: Obstetrics and Gynecology

## 2018-01-03 DIAGNOSIS — Z01411 Encounter for gynecological examination (general) (routine) with abnormal findings: Secondary | ICD-10-CM | POA: Insufficient documentation

## 2018-01-07 LAB — CYTOLOGY - PAP
Diagnosis: NEGATIVE
HPV (WINDOPATH): DETECTED — AB

## 2019-03-11 ENCOUNTER — Other Ambulatory Visit: Payer: Self-pay | Admitting: Obstetrics and Gynecology

## 2019-03-11 ENCOUNTER — Other Ambulatory Visit (HOSPITAL_COMMUNITY)
Admission: RE | Admit: 2019-03-11 | Discharge: 2019-03-11 | Disposition: A | Discharge status: Home / Self Care | Payer: 59 | Source: Ambulatory Visit | Attending: Obstetrics and Gynecology | Admitting: Obstetrics and Gynecology

## 2019-03-11 DIAGNOSIS — Z124 Encounter for screening for malignant neoplasm of cervix: Secondary | ICD-10-CM | POA: Diagnosis not present

## 2019-03-11 DIAGNOSIS — Z1231 Encounter for screening mammogram for malignant neoplasm of breast: Secondary | ICD-10-CM

## 2019-03-13 LAB — CYTOLOGY - PAP
Diagnosis: NEGATIVE
HPV: NOT DETECTED

## 2019-04-23 ENCOUNTER — Other Ambulatory Visit: Payer: Self-pay

## 2019-04-23 ENCOUNTER — Ambulatory Visit
Admission: RE | Admit: 2019-04-23 | Discharge: 2019-04-23 | Disposition: A | Discharge status: Home / Self Care | Payer: 59 | Source: Ambulatory Visit | Attending: Obstetrics and Gynecology | Admitting: Obstetrics and Gynecology

## 2019-04-23 DIAGNOSIS — Z1231 Encounter for screening mammogram for malignant neoplasm of breast: Secondary | ICD-10-CM

## 2019-05-14 NOTE — Patient Instructions (Addendum)
YOU WILL NEED A COVID TEST 05/20/19 .  [address] YOU WILL NEED TO QUARANTINE AFTER THE TEST UNTIL YOUR SURGERY.   Haley Suarez       Your procedure is scheduled on  05/23/19   Report to [facility]  at   9:00 A.M.   Call this number if you have problems the morning of surgery: [facility].  One person is allowed in the waiting room.   Remember:  Do not eat food or drink l after midnight.  Drink a pre op ensure the morning of surgery and have it finished by 8:00   You may have clear liquids until  8:00 AM   CLEAR LIQUID DIET   Foods Allowed                                                                     Foods Excluded  Coffee and tea, regular and decaf                             liquids that you cannot  Plain Jell-O any favor except red or purple                                           see through such as: Fruit ices (not with fruit pulp)                                     milk, soups, orange juice  Iced Popsicles                                    All solid food Carbonated beverages, regular and diet                                    Cranberry, grape and apple juices Sports drinks like Gatorade Lightly seasoned clear broth or consume(fat free) Sugar, honey syrup     Take these medicines the morning of surgery with A SIP OF WATER  none   Do not wear jewelry, make-up or nail polish.  Do not wear lotions, powders, or perfumes, or deoderant.  Do not shave 48 hours prior to surgery.  Men may shave face and neck.  Do not bring valuables to the hospital.  [facility] is not responsible for any belongings or valuables.  Contacts, dentures or bridgework may not be worn into surgery.  For patients admitted to the hospital, discharge time will be determined by your treatment team.  Patients discharged the day of surgery will not be allowed to  drive home.  You must have a responsible person with you for 24 hours  Special instructions:    Please read over the following fact sheets that you were  given:       [facility] - Preparing for Surgery  Before surgery, you can play an important role.   Because skin is not sterile, your skin needs to be as free of germs as possible.   You can reduce the number of germs on your skin by washing with CHG (chlorahexidine gluconate) soap before surgery.   CHG is an antiseptic cleaner which kills germs and bonds with the skin to continue killing germs even after washing. Please DO NOT use if you have an allergy to CHG or antibacterial soaps.  If your skin becomes reddened/irritated stop using the CHG and inform your nurse when you arrive at Short Stay. Do not shave (including legs and underarms) for at least 48 hours prior to the first CHG shower.  Please follow these instructions carefully:  1.  Shower with CHG Soap the night before surgery and the  morning of Surgery.  2.  If you choose to wash your hair, wash your hair first as usual with your  normal  shampoo.  3.  After you shampoo, rinse your hair and body thoroughly to remove the  shampoo.                                        4.  Use CHG as you would any other liquid soap.  You can apply chg directly  to the skin and wash                       Gently with a scrungie or clean washcloth.  5.  Apply the CHG Soap to your body ONLY FROM THE NECK DOWN.   Do not use on face/ open                           Wound or open sores. Avoid contact with eyes, ears mouth and genitals (private parts).                       Wash face,  Genitals (private parts) with your normal soap.             6.  Wash thoroughly, paying special attention to the area where your surgery  will be performed.  7.  Thoroughly rinse your body with warm water from the neck down.  8.  DO NOT shower/wash with your normal soap after using and rinsing off  the CHG Soap.                 9.  Pat yourself dry with a clean towel.            10.  Wear clean pajamas.            11.  Place clean sheets on your bed the night of your first shower and do not  sleep with pets. Day of Surgery : Do not apply any lotions/deodorants the morning of surgery.  Please wear clean clothes to the hospital/surgery center.  FAILURE TO FOLLOW THESE INSTRUCTIONS MAY RESULT IN THE CANCELLATION OF YOUR SURGERY PATIENT SIGNATURE_________________________________  NURSE SIGNATURE__________________________________  ________________________________________________________________________

## 2019-05-15 ENCOUNTER — Encounter (HOSPITAL_COMMUNITY): Payer: Self-pay

## 2019-05-15 ENCOUNTER — Other Ambulatory Visit: Payer: Self-pay

## 2019-05-15 ENCOUNTER — Encounter (HOSPITAL_COMMUNITY)
Admission: RE | Admit: 2019-05-15 | Discharge: 2019-05-15 | Disposition: A | Discharge status: Home / Self Care | Payer: 59 | Source: Ambulatory Visit | Attending: Obstetrics and Gynecology | Admitting: Obstetrics and Gynecology

## 2019-05-15 DIAGNOSIS — Z01812 Encounter for preprocedural laboratory examination: Secondary | ICD-10-CM | POA: Diagnosis not present

## 2019-05-15 DIAGNOSIS — D219 Benign neoplasm of connective and other soft tissue, unspecified: Secondary | ICD-10-CM | POA: Insufficient documentation

## 2019-05-15 HISTORY — DX: Other complications of anesthesia, initial encounter: T88.59XA

## 2019-05-15 LAB — CBC
HCT: 33.5 % — ABNORMAL LOW (ref 36.0–46.0)
Hemoglobin: 9.9 g/dL — ABNORMAL LOW (ref 12.0–15.0)
MCH: 23.6 pg — ABNORMAL LOW (ref 26.0–34.0)
MCHC: 29.6 g/dL — ABNORMAL LOW (ref 30.0–36.0)
MCV: 80 fL (ref 80.0–100.0)
Platelets: 513 10*3/uL — ABNORMAL HIGH (ref 150–400)
RBC: 4.19 MIL/uL (ref 3.87–5.11)
RDW: 15.8 % — ABNORMAL HIGH (ref 11.5–15.5)
WBC: 6.1 10*3/uL (ref 4.0–10.5)
nRBC: 0 % (ref 0.0–0.2)

## 2019-05-15 NOTE — Progress Notes (Signed)
PCP - Barth Kirks Redmon PA Cardiologist - none  Chest x-ray - no EKG - 10/03/16 Stress Test - no ECHO - no Cardiac Cath - no  Sleep Study - noCPAP -   Fasting Blood Sugar - NA Checks Blood Sugar _____ times a day  Blood Thinner Instructions:NA Aspirin Instructions: Last Dose:  Anesthesia review:   Patient denies shortness of breath, fever, cough and chest pain at PAT appointment  yes Patient verbalized understanding of instructions that were given to them at the PAT appointment. Patient was also instructed that they will need to review over the PAT instructions again at home before surgery. yes

## 2019-05-16 LAB — ABO/RH: ABO/RH(D): O POS

## 2019-05-20 ENCOUNTER — Other Ambulatory Visit (HOSPITAL_COMMUNITY)
Admission: RE | Admit: 2019-05-20 | Discharge: 2019-05-20 | Disposition: A | Discharge status: Home / Self Care | Payer: 59 | Source: Ambulatory Visit | Attending: Obstetrics and Gynecology | Admitting: Obstetrics and Gynecology

## 2019-05-20 DIAGNOSIS — Z01812 Encounter for preprocedural laboratory examination: Secondary | ICD-10-CM | POA: Insufficient documentation

## 2019-05-20 DIAGNOSIS — Z20828 Contact with and (suspected) exposure to other viral communicable diseases: Secondary | ICD-10-CM | POA: Diagnosis not present

## 2019-05-21 LAB — NOVEL CORONAVIRUS, NAA (HOSP ORDER, SEND-OUT TO REF LAB; TAT 18-24 HRS): SARS-CoV-2, NAA: NOT DETECTED

## 2019-05-23 ENCOUNTER — Ambulatory Visit (HOSPITAL_BASED_OUTPATIENT_CLINIC_OR_DEPARTMENT_OTHER)
Admission: RE | Admit: 2019-05-23 | Discharge: 2019-05-23 | Disposition: A | Discharge status: Home / Self Care | Payer: 59 | Attending: Obstetrics and Gynecology | Admitting: Obstetrics and Gynecology

## 2019-05-23 ENCOUNTER — Ambulatory Visit (HOSPITAL_BASED_OUTPATIENT_CLINIC_OR_DEPARTMENT_OTHER): Payer: 59 | Admitting: Physician Assistant

## 2019-05-23 ENCOUNTER — Other Ambulatory Visit: Payer: Self-pay

## 2019-05-23 ENCOUNTER — Encounter (HOSPITAL_BASED_OUTPATIENT_CLINIC_OR_DEPARTMENT_OTHER): Payer: Self-pay | Admitting: *Deleted

## 2019-05-23 ENCOUNTER — Encounter (HOSPITAL_BASED_OUTPATIENT_CLINIC_OR_DEPARTMENT_OTHER)
Admission: RE | Disposition: A | Discharge status: Home / Self Care | Payer: Self-pay | Source: Home / Self Care | Attending: Obstetrics and Gynecology

## 2019-05-23 ENCOUNTER — Ambulatory Visit (HOSPITAL_BASED_OUTPATIENT_CLINIC_OR_DEPARTMENT_OTHER): Payer: 59 | Admitting: Certified Registered Nurse Anesthetist

## 2019-05-23 DIAGNOSIS — N920 Excessive and frequent menstruation with regular cycle: Secondary | ICD-10-CM | POA: Diagnosis not present

## 2019-05-23 DIAGNOSIS — Z79899 Other long term (current) drug therapy: Secondary | ICD-10-CM | POA: Insufficient documentation

## 2019-05-23 DIAGNOSIS — D252 Subserosal leiomyoma of uterus: Secondary | ICD-10-CM | POA: Diagnosis not present

## 2019-05-23 DIAGNOSIS — D250 Submucous leiomyoma of uterus: Secondary | ICD-10-CM | POA: Diagnosis not present

## 2019-05-23 DIAGNOSIS — I10 Essential (primary) hypertension: Secondary | ICD-10-CM | POA: Diagnosis not present

## 2019-05-23 DIAGNOSIS — N736 Female pelvic peritoneal adhesions (postinfective): Secondary | ICD-10-CM | POA: Diagnosis not present

## 2019-05-23 DIAGNOSIS — E282 Polycystic ovarian syndrome: Secondary | ICD-10-CM | POA: Diagnosis not present

## 2019-05-23 DIAGNOSIS — D251 Intramural leiomyoma of uterus: Secondary | ICD-10-CM | POA: Insufficient documentation

## 2019-05-23 HISTORY — PX: ROBOT ASSISTED MYOMECTOMY: SHX5142

## 2019-05-23 HISTORY — DX: Other specified postprocedural states: Z98.890

## 2019-05-23 HISTORY — DX: Nausea with vomiting, unspecified: R11.2

## 2019-05-23 LAB — CBC
HCT: 27 % — ABNORMAL LOW (ref 36.0–46.0)
Hemoglobin: 8.1 g/dL — ABNORMAL LOW (ref 12.0–15.0)
MCH: 23.7 pg — ABNORMAL LOW (ref 26.0–34.0)
MCHC: 30 g/dL (ref 30.0–36.0)
MCV: 78.9 fL — ABNORMAL LOW (ref 80.0–100.0)
Platelets: 439 10*3/uL — ABNORMAL HIGH (ref 150–400)
RBC: 3.42 MIL/uL — ABNORMAL LOW (ref 3.87–5.11)
RDW: 15.6 % — ABNORMAL HIGH (ref 11.5–15.5)
WBC: 16.2 10*3/uL — ABNORMAL HIGH (ref 4.0–10.5)
nRBC: 0 % (ref 0.0–0.2)

## 2019-05-23 LAB — TYPE AND SCREEN
ABO/RH(D): O POS
Antibody Screen: NEGATIVE

## 2019-05-23 LAB — POCT PREGNANCY, URINE: Preg Test, Ur: NEGATIVE

## 2019-05-23 SURGERY — MYOMECTOMY, ROBOT-ASSISTED
Anesthesia: General | Site: Abdomen

## 2019-05-23 MED ORDER — OXYCODONE HCL 5 MG/5ML PO SOLN
5.0000 mg | Freq: Once | ORAL | Status: AC | PRN
  Filled 2019-05-23: qty 5

## 2019-05-23 MED ORDER — FENTANYL CITRATE (PF) 100 MCG/2ML IJ SOLN
INTRAMUSCULAR | Status: AC
  Filled 2019-05-23: qty 2

## 2019-05-23 MED ORDER — SCOPOLAMINE 1 MG/3DAYS TD PT72
MEDICATED_PATCH | TRANSDERMAL | Status: AC
  Filled 2019-05-23: qty 1

## 2019-05-23 MED ORDER — ARTIFICIAL TEARS OPHTHALMIC OINT
TOPICAL_OINTMENT | OPHTHALMIC | Status: AC
  Filled 2019-05-23: qty 3.5

## 2019-05-23 MED ORDER — VASOPRESSIN 20 UNIT/ML IV SOLN
INTRAVENOUS | Status: DC | PRN
  Administered 2019-05-23: 20 mL via INTRAMUSCULAR
  Administered 2019-05-23: 13:00:00 10 mL via INTRAMUSCULAR

## 2019-05-23 MED ORDER — DEXAMETHASONE SODIUM PHOSPHATE 10 MG/ML IJ SOLN
INTRAMUSCULAR | Status: AC
  Filled 2019-05-23: qty 1

## 2019-05-23 MED ORDER — OXYCODONE-ACETAMINOPHEN 7.5-325 MG PO TABS: 1.0000 | ORAL_TABLET | ORAL | 0 refills | Status: DC | PRN

## 2019-05-23 MED ORDER — ACETAMINOPHEN 500 MG PO TABS
ORAL_TABLET | ORAL | Status: AC
  Filled 2019-05-23: qty 2

## 2019-05-23 MED ORDER — KETAMINE HCL 10 MG/ML IJ SOLN
INTRAMUSCULAR | Status: AC
  Filled 2019-05-23: qty 1

## 2019-05-23 MED ORDER — FENTANYL CITRATE (PF) 100 MCG/2ML IJ SOLN
25.0000 ug | INTRAMUSCULAR | Status: DC | PRN
  Filled 2019-05-23: qty 1

## 2019-05-23 MED ORDER — ONDANSETRON HCL 4 MG/2ML IJ SOLN
4.0000 mg | Freq: Once | INTRAMUSCULAR | Status: DC | PRN
  Filled 2019-05-23: qty 2

## 2019-05-23 MED ORDER — OXYCODONE HCL 5 MG PO TABS
5.0000 mg | ORAL_TABLET | Freq: Once | ORAL | Status: AC | PRN
  Administered 2019-05-23: 5 mg via ORAL
  Filled 2019-05-23: qty 1

## 2019-05-23 MED ORDER — ACETAMINOPHEN 325 MG PO TABS
ORAL_TABLET | ORAL | Status: DC | PRN
  Administered 2019-05-23: 1000 mg via ORAL

## 2019-05-23 MED ORDER — SUGAMMADEX SODIUM 200 MG/2ML IV SOLN
INTRAVENOUS | Status: DC | PRN
  Administered 2019-05-23: 200 mg via INTRAVENOUS

## 2019-05-23 MED ORDER — OXYCODONE HCL 5 MG/5ML PO SOLN
5.0000 mg | Freq: Once | ORAL | Status: DC | PRN
  Filled 2019-05-23: qty 5

## 2019-05-23 MED ORDER — PHENYLEPHRINE HCL (PRESSORS) 10 MG/ML IV SOLN
INTRAVENOUS | Status: DC | PRN
  Administered 2019-05-23: 120 ug via INTRAVENOUS

## 2019-05-23 MED ORDER — ALBUMIN HUMAN 5 % IV SOLN
INTRAVENOUS | Status: DC | PRN
  Administered 2019-05-23 (×2): via INTRAVENOUS

## 2019-05-23 MED ORDER — MIDAZOLAM HCL 2 MG/2ML IJ SOLN
INTRAMUSCULAR | Status: DC | PRN
  Administered 2019-05-23: 2 mg via INTRAVENOUS

## 2019-05-23 MED ORDER — OXYCODONE HCL 5 MG PO TABS
5.0000 mg | ORAL_TABLET | Freq: Once | ORAL | Status: DC | PRN
  Filled 2019-05-23: qty 1

## 2019-05-23 MED ORDER — ONDANSETRON HCL 4 MG/2ML IJ SOLN
INTRAMUSCULAR | Status: DC | PRN
  Administered 2019-05-23: 4 mg via INTRAVENOUS

## 2019-05-23 MED ORDER — KETOROLAC TROMETHAMINE 30 MG/ML IJ SOLN
INTRAMUSCULAR | Status: DC | PRN
  Administered 2019-05-23: 30 mg via INTRAVENOUS

## 2019-05-23 MED ORDER — PROPOFOL 10 MG/ML IV BOLUS
INTRAVENOUS | Status: AC
  Filled 2019-05-23: qty 20

## 2019-05-23 MED ORDER — ALBUMIN HUMAN 5 % IV SOLN
INTRAVENOUS | Status: AC
  Filled 2019-05-23: qty 250

## 2019-05-23 MED ORDER — KETAMINE HCL 10 MG/ML IJ SOLN
INTRAMUSCULAR | Status: DC | PRN
  Administered 2019-05-23: 30 mg via INTRAVENOUS
  Administered 2019-05-23: 20 mg via INTRAVENOUS

## 2019-05-23 MED ORDER — LACTATED RINGERS IV SOLN
INTRAVENOUS | Status: DC
  Administered 2019-05-23 (×3): via INTRAVENOUS
  Filled 2019-05-23: qty 1000

## 2019-05-23 MED ORDER — ONDANSETRON HCL 4 MG PO TABS: 4.0000 mg | ORAL_TABLET | Freq: Every day | ORAL | 1 refills | Status: AC | PRN

## 2019-05-23 MED ORDER — CEFAZOLIN SODIUM-DEXTROSE 2-4 GM/100ML-% IV SOLN
INTRAVENOUS | Status: AC
  Filled 2019-05-23: qty 100

## 2019-05-23 MED ORDER — HYDROMORPHONE HCL 1 MG/ML IJ SOLN
INTRAMUSCULAR | Status: AC
  Filled 2019-05-23: qty 1

## 2019-05-23 MED ORDER — FENTANYL CITRATE (PF) 100 MCG/2ML IJ SOLN
INTRAMUSCULAR | Status: DC | PRN
  Administered 2019-05-23 (×2): 50 ug via INTRAVENOUS
  Administered 2019-05-23: 100 ug via INTRAVENOUS

## 2019-05-23 MED ORDER — SCOPOLAMINE 1 MG/3DAYS TD PT72
MEDICATED_PATCH | TRANSDERMAL | Status: DC | PRN
  Administered 2019-05-23: 1 via TRANSDERMAL

## 2019-05-23 MED ORDER — PROPOFOL 10 MG/ML IV BOLUS
INTRAVENOUS | Status: DC | PRN
  Administered 2019-05-23: 40 mg via INTRAVENOUS
  Administered 2019-05-23: 160 mg via INTRAVENOUS

## 2019-05-23 MED ORDER — SCOPOLAMINE 1 MG/3DAYS TD PT72
1.0000 | MEDICATED_PATCH | TRANSDERMAL | Status: DC
  Administered 2019-05-23: 1.5 mg via TRANSDERMAL
  Filled 2019-05-23: qty 1

## 2019-05-23 MED ORDER — PHENYLEPHRINE 40 MCG/ML (10ML) SYRINGE FOR IV PUSH (FOR BLOOD PRESSURE SUPPORT)
PREFILLED_SYRINGE | INTRAVENOUS | Status: AC
  Filled 2019-05-23: qty 10

## 2019-05-23 MED ORDER — METHYLENE BLUE 0.5 % INJ SOLN
INTRAVENOUS | Status: DC | PRN
  Administered 2019-05-23: 1 mL via SUBMUCOSAL

## 2019-05-23 MED ORDER — DEXAMETHASONE SODIUM PHOSPHATE 10 MG/ML IJ SOLN
INTRAMUSCULAR | Status: DC | PRN
  Administered 2019-05-23: 10 mg via INTRAVENOUS

## 2019-05-23 MED ORDER — HYDROMORPHONE HCL 1 MG/ML IJ SOLN
0.2500 mg | INTRAMUSCULAR | Status: DC | PRN
  Administered 2019-05-23: 0.25 mg via INTRAVENOUS
  Administered 2019-05-23: 0.5 mg via INTRAVENOUS
  Administered 2019-05-23: 0.25 mg via INTRAVENOUS
  Filled 2019-05-23: qty 0.5

## 2019-05-23 MED ORDER — MIDAZOLAM HCL 2 MG/2ML IJ SOLN
INTRAMUSCULAR | Status: AC
  Filled 2019-05-23: qty 2

## 2019-05-23 MED ORDER — SODIUM CHLORIDE 0.9 % IR SOLN
Status: DC | PRN
  Administered 2019-05-23: 3000 mL

## 2019-05-23 MED ORDER — BUPIVACAINE-EPINEPHRINE (PF) 0.5% -1:200000 IJ SOLN
INTRAMUSCULAR | Status: DC | PRN
  Administered 2019-05-23: 10 mL

## 2019-05-23 MED ORDER — LIDOCAINE 2% (20 MG/ML) 5 ML SYRINGE
INTRAMUSCULAR | Status: AC
  Filled 2019-05-23: qty 5

## 2019-05-23 MED ORDER — LIDOCAINE 2% (20 MG/ML) 5 ML SYRINGE
INTRAMUSCULAR | Status: DC | PRN
  Administered 2019-05-23: 40 mg via INTRAVENOUS

## 2019-05-23 MED ORDER — ROCURONIUM BROMIDE 10 MG/ML (PF) SYRINGE
PREFILLED_SYRINGE | INTRAVENOUS | Status: DC | PRN
  Administered 2019-05-23: 20 mg via INTRAVENOUS
  Administered 2019-05-23: 50 mg via INTRAVENOUS
  Administered 2019-05-23: 20 mg via INTRAVENOUS
  Administered 2019-05-23: 10 mg via INTRAVENOUS

## 2019-05-23 MED ORDER — KETOROLAC TROMETHAMINE 30 MG/ML IJ SOLN
INTRAMUSCULAR | Status: AC
  Filled 2019-05-23: qty 1

## 2019-05-23 MED ORDER — OXYCODONE HCL 5 MG PO TABS
ORAL_TABLET | ORAL | Status: AC
  Filled 2019-05-23: qty 1

## 2019-05-23 MED ORDER — CEFAZOLIN SODIUM-DEXTROSE 2-4 GM/100ML-% IV SOLN
2.0000 g | INTRAVENOUS | Status: AC
  Administered 2019-05-23: 2 g via INTRAVENOUS
  Filled 2019-05-23: qty 100

## 2019-05-23 MED ORDER — ONDANSETRON HCL 4 MG/2ML IJ SOLN
INTRAMUSCULAR | Status: AC
  Filled 2019-05-23: qty 2

## 2019-05-23 MED ORDER — ROCURONIUM BROMIDE 10 MG/ML (PF) SYRINGE
PREFILLED_SYRINGE | INTRAVENOUS | Status: AC
  Filled 2019-05-23: qty 10

## 2019-05-23 SURGICAL SUPPLY — 82 items
ADH SKN CLS APL DERMABOND .7 (GAUZE/BANDAGES/DRESSINGS) ×1
BARRIER ADHS 3X4 INTERCEED (GAUZE/BANDAGES/DRESSINGS) IMPLANT
BLADE LAP MORCELLATOR 15X9.5 (ELECTROSURGICAL) IMPLANT
BLADE MORCELLATOR EXT  12.5X15 (ELECTROSURGICAL)
BLADE MORCELLATOR EXT 12.5X15 (ELECTROSURGICAL) IMPLANT
BRR ADH 4X3 ABS CNTRL BYND (GAUZE/BANDAGES/DRESSINGS)
BRR ADH 6X5 SEPRAFILM 1 SHT (MISCELLANEOUS) ×2
CATH ROBINSON RED A/P 16FR (CATHETERS) IMPLANT
CONT PATH 16OZ SNAP LID 3702 (MISCELLANEOUS) ×2 IMPLANT
COVER BACK TABLE 60X90IN (DRAPES) ×3 IMPLANT
COVER TIP SHEARS 8 DVNC (MISCELLANEOUS) ×1 IMPLANT
COVER TIP SHEARS 8MM DA VINCI (MISCELLANEOUS) ×1
COVER WAND RF STERILE (DRAPES) ×1 IMPLANT
DECANTER SPIKE VIAL GLASS SM (MISCELLANEOUS) ×6 IMPLANT
DEFOGGER SCOPE WARMER CLEARIFY (MISCELLANEOUS) ×2 IMPLANT
DEPRESSOR TONGUE BLADE STERILE (MISCELLANEOUS) ×2 IMPLANT
DERMABOND ADVANCED (GAUZE/BANDAGES/DRESSINGS) ×1
DERMABOND ADVANCED .7 DNX12 (GAUZE/BANDAGES/DRESSINGS) IMPLANT
DRAPE ARM DVNC X/XI (DISPOSABLE) ×4 IMPLANT
DRAPE COLUMN DVNC XI (DISPOSABLE) ×1 IMPLANT
DRAPE DA VINCI XI ARM (DISPOSABLE) ×4
DRAPE DA VINCI XI COLUMN (DISPOSABLE) ×1
DRSG OPSITE POSTOP 3X4 (GAUZE/BANDAGES/DRESSINGS) ×2 IMPLANT
DURAPREP 26ML APPLICATOR (WOUND CARE) ×2 IMPLANT
ELECT REM PT RETURN 9FT ADLT (ELECTROSURGICAL) ×4
ELECTRODE REM PT RTRN 9FT ADLT (ELECTROSURGICAL) ×2 IMPLANT
GLOVE BIO SURGEON STRL SZ7 (GLOVE) ×3 IMPLANT
GLOVE BIO SURGEON STRL SZ8 (GLOVE) ×6 IMPLANT
GLOVE BIO SURGEON STRL SZ8.5 (GLOVE) ×1 IMPLANT
GLOVE BIOGEL PI IND STRL 7.0 (GLOVE) ×2 IMPLANT
GLOVE BIOGEL PI IND STRL 8.5 (GLOVE) ×1 IMPLANT
GLOVE BIOGEL PI INDICATOR 7.0 (GLOVE) ×3
GLOVE BIOGEL PI INDICATOR 8.5 (GLOVE) ×1
GLOVE SURG SS PI 8.5 STRL IVOR (GLOVE) ×2
GLOVE SURG SS PI 8.5 STRL STRW (GLOVE) IMPLANT
IRRIG SUCT STRYKERFLOW 2 WTIP (MISCELLANEOUS) ×2
IRRIGATION SUCT STRKRFLW 2 WTP (MISCELLANEOUS) ×1 IMPLANT
LEGGING LITHOTOMY PAIR STRL (DRAPES) ×2 IMPLANT
MANIFOLD NEPTUNE II (INSTRUMENTS) ×1 IMPLANT
MANIPULATOR UTERINE 4.5 ZUMI (MISCELLANEOUS) ×2 IMPLANT
MANIPULATOR UTERINE 7CM CLEARV (MISCELLANEOUS) IMPLANT
NEEDLE INSUFFLATION 120MM (ENDOMECHANICALS) ×2 IMPLANT
NEEDLE SPNL 22GX7 QUINCKE BK (NEEDLE) ×2 IMPLANT
OBTURATOR OPTICAL STANDARD 8MM (TROCAR) ×1
OBTURATOR OPTICAL STND 8 DVNC (TROCAR) ×1
OBTURATOR OPTICALSTD 8 DVNC (TROCAR) IMPLANT
PACK ROBOT WH (CUSTOM PROCEDURE TRAY) ×2 IMPLANT
PACK ROBOTIC GOWN (GOWN DISPOSABLE) ×2 IMPLANT
PACK TRENDGUARD 450 HYBRID PRO (MISCELLANEOUS) IMPLANT
PAD PREP 24X48 CUFFED NSTRL (MISCELLANEOUS) ×2 IMPLANT
PROTECTOR NERVE ULNAR (MISCELLANEOUS) ×4 IMPLANT
SEAL CANN UNIV 5-8 DVNC XI (MISCELLANEOUS) ×3 IMPLANT
SEAL XI 5MM-8MM UNIVERSAL (MISCELLANEOUS) ×3
SEPRAFILM MEMBRANE 5X6 (MISCELLANEOUS) ×4 IMPLANT
SET CYSTO W/LG BORE CLAMP LF (SET/KITS/TRAYS/PACK) IMPLANT
SET TRI-LUMEN FLTR TB AIRSEAL (TUBING) IMPLANT
SHEARS HARMONIC ACE PLUS 36CM (ENDOMECHANICALS) ×1 IMPLANT
SPONGE LAP 18X18 RF (DISPOSABLE) ×1 IMPLANT
STOPCOCK 4 WAY LG BORE MALE ST (IV SETS) ×1 IMPLANT
SUT DVC VLOC 180 2-0 12IN GS21 (SUTURE)
SUT MNCRL AB 4-0 PS2 18 (SUTURE) ×3 IMPLANT
SUT VIC AB 2-0 CT1 (SUTURE) ×8 IMPLANT
SUT VIC AB 2-0 CT1 27 (SUTURE) ×2
SUT VIC AB 2-0 CT1 TAPERPNT 27 (SUTURE) IMPLANT
SUT VIC AB 2-0 CT2 27 (SUTURE) IMPLANT
SUT VIC AB 3-0 SH 27 (SUTURE) ×6
SUT VIC AB 3-0 SH 27XBRD (SUTURE) IMPLANT
SUT VIC AB 4-0 SH 27 (SUTURE) ×2
SUT VIC AB 4-0 SH 27XANBCTRL (SUTURE) IMPLANT
SUT VICRYL 0 UR6 27IN ABS (SUTURE) ×1 IMPLANT
SUT VLOC 180 2-0 9IN GS21 (SUTURE) IMPLANT
SUT VLOC 180 3-0 9IN GS21 (SUTURE) IMPLANT
SUTURE DVC VL 180 2-0 12INGS21 (SUTURE) ×1 IMPLANT
SYR 50ML LL SCALE MARK (SYRINGE) ×1 IMPLANT
SYRINGE 60CC LL (MISCELLANEOUS) ×2 IMPLANT
SYS LAPSCP GELPORT 120MM (MISCELLANEOUS) ×2
SYSTEM CARTER THOMASON II (TROCAR) ×2 IMPLANT
SYSTEM LAPSCP GELPORT 120MM (MISCELLANEOUS) IMPLANT
TOWEL OR 17X26 10 PK STRL BLUE (TOWEL DISPOSABLE) ×2 IMPLANT
TRAY FOLEY W/BAG SLVR 14FR LF (SET/KITS/TRAYS/PACK) ×2 IMPLANT
TRENDGUARD 450 HYBRID PRO PACK (MISCELLANEOUS) ×2
TUBING EVAC SMOKE HEATED PNEUM (TUBING) ×1 IMPLANT

## 2019-05-23 NOTE — Progress Notes (Signed)
MD notified patient does not do well with Zofran,  He will call in Phenergan.  Scope patch was replaced we were unable to find the one placed pre-op.  Pressure Dressing placed over honeycomb dressing remained CDI.  Patient and Husband were given discharge instructions and she was wheeled to car by RN.

## 2019-05-23 NOTE — Transfer of Care (Signed)
Immediate Anesthesia Transfer of Care Note  Patient: Haley Suarez  Procedure(s) Performed: LAPAROSCOPIC ASSISTED MYOMECTOMY WITH LYSIS OF ADHESIONS, CHROMEPERTUBATION (N/A Abdomen)  Patient Location: PACU  Anesthesia Type:General  Level of Consciousness: awake, alert , oriented and patient cooperative  Airway & Oxygen Therapy: Patient Spontanous Breathing and Patient connected to nasal cannula oxygen  Post-op Assessment: Report given to RN and Post -op Vital signs reviewed and stable  Post vital signs: Reviewed and stable  Last Vitals:  Vitals Value Taken Time  BP 133/62 05/23/19 1607  Temp    Pulse 80 05/23/19 1609  Resp 23 05/23/19 1609  SpO2 100 % 05/23/19 1609  Vitals shown include unvalidated device data.  Last Pain:  Vitals:   05/23/19 0911  TempSrc: Oral         Complications: No apparent anesthesia complications

## 2019-05-23 NOTE — Anesthesia Procedure Notes (Addendum)
Procedure Name: Intubation Date/Time: 05/23/2019 12:13 PM Performed by: Wanita Chamberlain, CRNA Pre-anesthesia Checklist: Patient identified, Emergency Drugs available, Suction available, Patient being monitored and Timeout performed Patient Re-evaluated:Patient Re-evaluated prior to induction Oxygen Delivery Method: Circle system utilized Preoxygenation: Pre-oxygenation with 100% oxygen Induction Type: IV induction Ventilation: Mask ventilation without difficulty and Oral airway inserted - appropriate to patient size Laryngoscope Size: Mac and 3 Grade View: Grade II Tube type: Oral Tube size: 7.5 mm Number of attempts: 2 Airway Equipment and Method: Stylet,  Oral airway and Bite block Placement Confirmation: breath sounds checked- equal and bilateral,  CO2 detector,  positive ETCO2 and ETT inserted through vocal cords under direct vision Secured at: 23 cm Tube secured with: Tape Dental Injury: Teeth and Oropharynx as per pre-operative assessment  Difficulty Due To: Difficulty was anticipated Comments: 1st attempt w/ grade II view of v. Cords. Cricoid manipulation for Grade I view of v. Cords however, difficulty extending pt's head due to extensive hair braids. [place]uard head positioner temp. Removed for better neck extension VSS throughout.

## 2019-05-23 NOTE — Anesthesia Preprocedure Evaluation (Addendum)
Anesthesia Evaluation  Patient identified by MRN, date of birth, ID band Patient awake    History of Anesthesia Complications (+) PONV  Airway Mallampati: I  TM Distance: >3 FB Neck ROM: Full    Dental  (+) Teeth Intact, Dental Advisory Given   Pulmonary    breath sounds clear to auscultation       Cardiovascular Exercise Tolerance: Good hypertension, Pt. on medications  Rhythm:Regular Rate:Normal     Neuro/Psych    GI/Hepatic Neg liver ROS, GERD  ,  Endo/Other  negative endocrine ROS  Renal/GU negative Renal ROS     Musculoskeletal   Abdominal   Peds  Hematology  (+) anemia ,   Anesthesia Other Findings   Reproductive/Obstetrics                         Anesthesia Physical Anesthesia Plan  ASA: II  Anesthesia Plan: General   Post-op Pain Management:    Induction: Intravenous  PONV Risk Score and Plan: Ondansetron and Dexamethasone  Airway Management Planned: Oral ETT  Additional Equipment:   Intra-op Plan:   Post-operative Plan: Extubation in OR  Informed Consent: I have reviewed the patients History and Physical, chart, labs and discussed the procedure including the risks, benefits and alternatives for the proposed anesthesia with the patient or authorized representative who has indicated his/her understanding and acceptance.     Dental advisory given  Plan Discussed with: CRNA and Anesthesiologist  Anesthesia Plan Comments:         Anesthesia Quick Evaluation

## 2019-05-23 NOTE — H&P (Addendum)
Haley Suarez is a 43 y.o. female , originally referred to me by Dr. Landry Mellow, for robotic assisted myomectomy.  I have performed robotic assisted myomectomy on her in my practice at [facility].  She was found to have a uterus enlarged with fibroids to 21 week size causing bulk symptoms and heavy menstrual cycles. CBC was drawn at pre-op last Thursday.   She has been having monthly periods but with heavy flow and prolonged duration.  Patient would like to preserve her childbearing potential.  Pertinent Gynecological History: Menses: flow is excessive with use of 3 pads or tampons on heaviest days Bleeding: dysfunctional uterine bleeding Contraception: none DES exposure: denies Blood transfusions: none Sexually transmitted diseases: no past history  Last mammogram: normal Last pap: normal    Menstrual History: Menarche age: 52 No LMP recorded.    Past Medical History:  Diagnosis Date  . Complication of anesthesia    slow to wake up  . Dysrhythmia 2018   PVCs  . GERD (gastroesophageal reflux disease)   . PCOS (polycystic ovarian syndrome)   . PONV (postoperative nausea and vomiting)                     Past Surgical History:  Procedure Laterality Date  . BREAST BIOPSY Left   . removal of fibroids               Family History  Problem Relation Age of Onset  . GER disease Father   . Hepatitis C Mother   . Fibromyalgia Mother   . Ovarian cancer Mother        s/p TA  . Cancer Mother   . Colon cancer Mother 32  . Cancer Maternal Grandmother        Colorectal cancer  . Breast cancer Neg Hx    No hereditary disease.  No cancer of breast, ovary, uterus. No cutaneous leiomyomatosis or renal cell carcinoma.  Social History   Socioeconomic History  . Marital status: Single    Spouse name: Not on file  . Number of children: Not on file  . Years of education: Not on file  . Highest education level: Not on file  Occupational History  . Not on file   Social Needs  . Financial resource strain: Not on file  . Food insecurity    Worry: Not on file    Inability: Not on file  . Transportation needs    Medical: Not on file    Non-medical: Not on file  Tobacco Use  . Smoking status: Never Smoker  . Smokeless tobacco: Never Used  Substance and Sexual Activity  . Alcohol use: No  . Drug use: No  . Sexual activity: Yes  Lifestyle  . Physical activity    Days per week: Not on file    Minutes per session: Not on file  . Stress: Not on file  Relationships  . Social Herbalist on phone: Not on file    Gets together: Not on file    Attends religious service: Not on file    Active member of club or organization: Not on file    Attends meetings of clubs or organizations: Not on file    Relationship status: Not on file  . Intimate partner violence    Fear of current or ex partner: Not on file    Emotionally abused: Not on file    Physically abused: Not on file  Forced sexual activity: Not on file  Other Topics Concern  . Not on file  Social History Narrative  . Not on file    Allergies  Allergen Reactions  . Entex Lq [Phenylephrine-Guaifenesin] Palpitations    Entex LA was what she had a reaction to--increase heart rate    No current facility-administered medications on file prior to encounter.    Current Outpatient Medications on File Prior to Encounter  Medication Sig Dispense Refill  . cholecalciferol (VITAMIN D3) 25 MCG (1000 UT) tablet Take 1,000 Units by mouth daily.    . ferrous sulfate 325 (65 FE) MG EC tablet Take 1 tablet (325 mg total) by mouth daily with breakfast. 30 tablet 2  . Multiple Vitamin (MULTIVITAMIN WITH MINERALS) TABS tablet Take 1 tablet by mouth daily.        Review of Systems  Constitutional: Negative.   HENT: Negative.   Eyes: Negative.   Respiratory: Negative.   Cardiovascular: Negative.   Gastrointestinal: Negative.   Genitourinary: Negative.   Musculoskeletal: Negative.    Skin: Negative.   Neurological: Negative.   Endo/Heme/Allergies: Negative.   Psychiatric/Behavioral: Negative.      Physical Exam  BP (!) 156/79   Pulse 100   Temp 99.3 F (37.4 C) (Oral)   Resp 16   Ht 5\' 1"  (1.549 m)   Wt 97.6 kg   SpO2 99%   BMI 40.67 kg/m  Constitutional: She is oriented to person, place, and time. She appears well-developed and well-nourished.  HENT:  Head: Normocephalic and atraumatic.  Nose: Nose normal.  Mouth/Throat: Oropharynx is clear and moist. No oropharyngeal exudate.  Eyes: Conjunctivae normal and EOM are normal. Pupils are equal, round, and reactive to light. No scleral icterus.  Neck: Normal range of motion. Neck supple. No tracheal deviation present. No thyromegaly present.  Cardiovascular: Normal rate.   Respiratory: Effort normal and breath sounds normal.  GI: Soft. Bowel sounds are normal. She exhibits no distension and no mass. There is no tenderness.  Lymphadenopathy:    She has no cervical adenopathy.  Neurological: She is alert and oriented to person, place, and time. She has normal reflexes.  Skin: Skin is warm.  Psychiatric: She has a normal mood and affect. Her behavior is normal. Judgment and thought content normal.    Assessment/Plan:  Recurrent transmural and intramural uterine myomas (largest myoma measuring 9 cm in diameter), causing menorrhagia and pressure sensation. Preoperative for robot assisted myomectomy Benefits and risks of robotic myomectomy were discussed with the patient and her family member again.  Bowel prep instructions were given.  All of patient's questions were answered.  She verbalized understanding.  She knows that she will need a cesarean delivery for future pregnancies, and that it is recommended she does not conceive for 2-3 months for uterus to heal.

## 2019-05-23 NOTE — Op Note (Addendum)
Operative Note  Preoperative diagnosis: Uterine fibroids, menorrhagia  Postoperative diagnosis: Uterine myomas (x 10), intramural, transmural, submucosal and subserosal, extensive pelvic and abdominal adhesions  Procedure: Laparoscopy, lysis of adhesions, enterolysis, GelPort assisted myomectomy, chromotubation  Anesthesia: Gen. endotracheal  Complications: None  Estimated blood loss: 700 mL  Specimens: Uterine myomas, including 1 submucosal myoma to pathology  Findings: On examination under anesthesia, external genitalia, Bartholin's, Skene's, and urethra were normal. The vagina was normal. The cervix was nulliparous and appeared grossly normal. The uterus was 16-17 week size, firm and mobile with irregularities caused by myomas. It sounded to 18 cm.  On laparoscopy, upper abdomen, liver surface and diaphragm surfaces were normal. Gallbladder was normal. The appendix was visualized. There was an omental adhesion to the right lateral laparoscopic port site which was taken down.  The uterus was enlarged with multiple myomas and it was covered with filmy and dense adhesions from the omentum such that the fundus and the anterior aspect initially could not be visualized.  All of these adhesions were taken down. Parietal peritoneum was adherent to the portions of the anterior uterine serosa and this was freed up. The uterus contained multiple myomas (10 of them were removed during the procedure, leaving 1 right lateral 6 to 7 mm myoma extending into the right broad ligament and uterine arteries in the lower uterine segment).  Conspicuously there were 4-5 myomas that were subserosal/intramural on the fundal and anterior aspect of the uterus that were 4-5 cm.  There was a degenerating myoma that was 8 to 9 cm in the left aspect of the uterus.  There are several other myomas that were 0.5 to 2 cm in size in satellite locations to the larger myomas and they were removed as well. The left tube was  adherent to the posterior aspect of the tube.  The fimbria of the left tube were rated at 4 out of 5.  The left ovary had filmy adhesions to the epiploic appendices which were taken down.  The right ovary had filmy adhesion to the epiploic appendices which was taken down.  The right tube was normal with 4 out of 5 fimbria.  During the chromotubation the tubes did not fill with methylene blue however this may have been a artifact due to the large size of the uterus and the uterine cavity.  There were no proximal tubal anomalies. Except for filmy adhesions posterior cul-de-sac peritoneum appeared mostly normal.  Description of the procedure: The patient was placed in dorsal supine position and general endotracheal anesthesia was given. 2 g of cefazolin were given intravenously for prophylaxis. Patient was placed in lithotomy position. She was prepped and draped in sterile manner. A Foley catheter was inserted into the bladder. A ZUMI catheter was placed into the uterine cavity.  The uterus sounded to 18 cm.  This was connected to a syringe containing diluted methylene blue solution which was used to define the endometrium during the myomectomy. The surgeon was regloved and a surgical field was created on the abdomen.  After preemptive anesthesia of all surgical sites with 0.5% bupivacaine, a 5 mm intraumbilical skin incision was made and a Verress needle was inserted. Its correct location was confirmed. A pneumoperitoneum was created with carbon dioxide.    8 mm robotic laparoscope was inserted and video laparoscopy was started . A left lower quadrant 8 mm and a right lower quadrant  8 mm incisions were made and robotic trochars were placed under direct visualization. Above findings were noted.  Due to the layered appearance of the multiple myomas and presence of both anterior, fundal and posterior adhesions and the deep nature of the large myomas on a previously myomectomy eyes patient, I made the decision not  to proceed with the [employer] robot and instead attempt to surgery with GelPort assistance. First extensive lysis of adhesions was performed on the layers of omental and epiploic adhesions covering the entire anterior and fundal and some of the posterior aspect of the uterus.  The adhesions were extensive and it took 30 minutes to complete the adhesiolysis and enterolysis.  This was necessary for the completion of the intended procedure. A dilute solution of vasopressin (0.4 units per mL) was injected into the myometrium overlying the fundal myoma, until the myometrium blanched. A needle electrode with 37 W of cutting current  was used to make a transverse incision on the myometrium overlying the fundal 6 cm myoma. The myoma was grasped with tenaculum and dissection was started.  We then made a 4 cm transverse suprapubic incision to insert a GelPort. After dissection of the anatomic layers, the peritoneal cavity was entered. A GelPort was placed and the rest of the case was performed either using this port as a laparoscopic port or as a minilaparotomy port.  The rest of the myoma had to be in situ morcellated to eventually take it out of this 3 cm incision. This was carried out by blunt and sharp dissection and in situ morcellation was performed with #10 blade. The endometrial cavity was entered at the lower aspect of another 6 cm myoma. This was detected by the egress of methylene blue solution injected transcervically.  Through the opening afforded by this entry, a 2 x 2 x 1 cm type 0 submucosal myoma was also found in the left anterior aspect of the cavity and this was removed through this opening.  A posterior 3 x 3 cm type II myoma was palpated and decision was made to remove this myoma transendometrially.  The endometrium overlying this myoma was incised sharply with a scalpel and the myoma was grasped with towel clips and by blunt and sharp dissection the myoma was removed.  It was noted that it was  large of the myoma that was 5 to 6 cm and the remaining, intramural portion of the myoma was also removed through the same defect.  A figure-of-eight suture was placed with 2-0 Vicryl in the defect created by this approach. The endometrium was closed with 4-0 Vicryl continuous suture. We made a separate anterior incision overlying the 9 cm myoma and encountered a degenerating mass.  This myoma was removed in pieces by blunt and sharp dissection.  The myoma defects were closed in 3-2 layers: The first layer was a deep myometrial suture of 2-0 Vicryl continuous interlocking suture, the second layer was a superficial myometrial layer of 2-0 Vicryl continuous suture. A 3-0 Vicryl continuous suture was placed on the serosa and the most superficial myometrium.  After appreciable shrinkage over and lysis of adhesions and mobility was gained in the uterus.  It was noted that a 6-7 cm right lower uterine segment myoma still remained.  Due to the blood loss up to that point in patient's preoperative anemia and the proximity of this mass to the right uterine arteries, I decided not to continue with further dissection to attempt to remove this myoma. The suprapubic fascial incision was closed with 2-0 Vicryl continuous suture. Subcutaneous tissue was irrigated and aspirated good hemostasis was achieved.  The abdomen and the pelvis was carefully inspected under laparoscopic visualization and the pelvis was copiously irrigated and aspirated. A slurry of 2 sheets of Seprafilm in 60 mL of normal saline was injected as an adhesion barrier into the pelvis.  The gas was allowed to escape. The instrument and the lap pad count were correct. The trochars were removed. The skin incisions were approximated with 4-0 Monocryl in subcuticular sutures, including the 4 cm skin incision that belonged to the [place] site.  The patient tolerated the procedure well and was transferred to recovery room in satisfactory  condition.  SPECIAL NOTE: Because of the extent of the myometrial incision during the uterine myomectomy, it is recommended that this patient deliver by a cesarean section with her future pregnancies.  Governor Specking, MD

## 2019-05-23 NOTE — Anesthesia Postprocedure Evaluation (Signed)
Anesthesia Post Note  Patient: Haley Suarez  Procedure(s) Performed: LAPAROSCOPIC ASSISTED MYOMECTOMY WITH LYSIS OF ADHESIONS, CHROMEPERTUBATION (N/A Abdomen)     Patient location during evaluation: PACU Anesthesia Type: General Level of consciousness: awake and alert Pain management: pain level controlled Vital Signs Assessment: post-procedure vital signs reviewed and stable Respiratory status: spontaneous breathing, nonlabored ventilation and respiratory function stable Cardiovascular status: blood pressure returned to baseline and stable Postop Assessment: no apparent nausea or vomiting Anesthetic complications: no    Last Vitals:  Vitals:   05/23/19 1645 05/23/19 1700  BP: 138/63 137/64  Pulse: 78 81  Resp: 20 (!) 21  Temp:    SpO2: 100% 100%    Last Pain:  Vitals:   05/23/19 1715  TempSrc:   PainSc: [place]

## 2019-05-23 NOTE — Discharge Instructions (Signed)
Myomectomy, Care After °This sheet gives you information about how to care for yourself after your procedure. Your health care provider may also give you more specific instructions. If you have problems or questions, contact your health care provider. °What can I expect after the procedure? °After the procedure, it is common to have: °· Pain in your abdomen, especially at the incision areas. You will be given pain medicine to control the pain. °· Tiredness. This is a normal part of the recovery process. Your energy level will return to normal over the coming weeks. °· Vaginal bleeding. This is normal and will stop in the coming weeks. °· Constipation. °Recovery time from this procedure will depend on the type of procedure you had and your general overall health prior to the procedure. °Follow these instructions at home: °Medicines °· Take over-the-counter and prescription medicines only as told by your health care provider. °· Do not take aspirin because it can cause bleeding. °· If you were prescribed an antibiotic medicine, use it as told by your health care provider. Do not stop using the antibiotic even if you start to feel better. °· Do not drive or use heavy machinery while taking prescription pain medicine. °· Do not drink alcohol while taking prescription pain medicine. °Incision care ° °· Follow instructions from your health care provider about how to take care of any incisions. Make sure you: °? Wash your hands with soap and water before you change your bandage (dressing). If soap and water are not available, use hand sanitizer. °? Change your dressing as told by your health care provider. °? Leave stitches (sutures), skin glue, or adhesive strips in place. These skin closures may need to stay in place for 2 weeks or longer. If adhesive strip edges start to loosen and curl up, you may trim the loose edges. Do not remove adhesive strips completely unless your health care provider tells you to do  that. °· Check your incision areas every day for signs of infection. Check for: °? Redness, swelling, or pain. °? Fluid or blood. °? Warmth. °? Pus or a bad smell. °· Do not take baths, swim, or use a hot tub until your health care provider approves. Take showers as directed by your health care provider. °Activity °· Return to your normal activities as told by your health care provider. Ask your health care provider what activities are safe for you. °· Do not do activities that require a lot of effort until your health care provider says it is okay. °· Do not lift anything that is heavier than 15 lb (6.8 kg) until your health care provider says that it is safe. °· Do not douche, use tampons, or have sexual intercourse until your health care provider approves. °· Walk daily but take frequent rest breaks if you tire easily. °· Continue to practice deep breathing and coughing. If it hurts to cough, try holding a pillow against your belly as you cough. °· Do not drive until your health care provider approves. °General instructions °· To prevent or treat constipation while you are taking prescription pain medicine, your health care provider may recommend that you: °? Drink enough fluid to keep your urine clear or pale yellow. °? Take over-the-counter or prescription medicines. °? Eat foods that are high in fiber, such as fresh fruits and vegetables, whole grains, and beans. °? Limit foods that are high in fat and processed sugars, such as fried and sweet foods. °· Take your temperature twice a day   and write it down. If you develop a fever, this may be a sign that you have an infection.  Do not drink alcohol.  Have someone help you at home for 1 week or until you can do your own household activities.  Keep all follow-up visits as told by your health care provider. This is important. Contact a health care provider if:  You have a fever.  You have increasing abdominal pain that is not relieved with  medicine.  You have nausea, vomiting, or diarrhea.  You have pain when you urinate or you have blood in your urine.  You have a rash on your body.  You have pain or redness where your IV access tube was inserted.  You have redness, swelling, or pain around an incision.  You have fluid or blood coming from an incision.  An incision feels warm to the touch.  You have pus or a bad smell coming from an incision. Get help right away if:  You have weakness or light-headedness.  You have pain, swelling, or redness in your legs.  You have chest pain.  You faint.  You have shortness of breath.  You have heavy vaginal bleeding.  You have an incision that is opening up. Summary  Recovery time from this procedure will depend on the type of procedure you had and your general overall health prior to the procedure.  If you were prescribed an antibiotic medicine, use it as told by your health care provider. Do not stop using the antibiotic even if you start to feel better.  Do not douche, use tampons, or have sexual intercourse until your health care provider approves.  Return to your normal activities as told by your health care provider. Ask your health care provider what activities are safe for you. This information is not intended to replace advice given to you by your health care provider. Make sure you discuss any questions you have with your health care provider. Document Released: 12/14/2010 Document Revised: 07/06/2017 Document Reviewed: 08/24/2016 Elsevier Patient Education  2020 [employer].   No advil, aleve, motrin, ibuprofen until 930 pm tonight   Post Anesthesia Home Care Instructions  Activity: Get plenty of rest for the remainder of the day. A responsible individual must stay with you for 24 hours following the procedure.  For the next 24 hours, DO NOT: -Drive a car -Paediatric nurse -Drink alcoholic beverages -Take any medication unless instructed by your  physician -Make any legal decisions or sign important papers.  Meals: Start with liquid foods such as gelatin or soup. Progress to regular foods as tolerated. Avoid greasy, spicy, heavy foods. If nausea and/or vomiting occur, drink only clear liquids until the nausea and/or vomiting subsides. Call your physician if vomiting continues.  Special Instructions/Symptoms: Your throat may feel dry or sore from the anesthesia or the breathing tube placed in your throat during surgery. If this causes discomfort, gargle with warm salt water. The discomfort should disappear within 24 hours.  If you had a scopolamine patch placed behind your ear for the management of post- operative nausea and/or vomiting:  1. The medication in the patch is effective for 72 hours, after which it should be removed.  Wrap patch in a tissue and discard in the trash. Wash hands thoroughly with soap and water. 2. You may remove the patch earlier than 72 hours if you experience unpleasant side effects which may include dry mouth, dizziness or visual disturbances. 3. Avoid touching the patch. Wash your  hands with soap and water after contact with the patch.

## 2019-05-26 ENCOUNTER — Encounter (HOSPITAL_BASED_OUTPATIENT_CLINIC_OR_DEPARTMENT_OTHER): Payer: Self-pay | Admitting: Obstetrics and Gynecology

## 2019-05-27 LAB — SURGICAL PATHOLOGY

## 2019-05-28 NOTE — Addendum Note (Signed)
Addendum  created 05/28/19 1234 by Wanita Chamberlain, CRNA   Intraprocedure Meds edited

## 2019-10-06 ENCOUNTER — Ambulatory Visit: Payer: 59 | Attending: Internal Medicine

## 2019-10-06 DIAGNOSIS — Z23 Encounter for immunization: Secondary | ICD-10-CM | POA: Insufficient documentation

## 2019-10-06 NOTE — Progress Notes (Signed)
   Covid-19 Vaccination Clinic  Name:  Haley Suarez    MRN: [id number] DOB: 1976/03/28  10/06/2019  [name] was observed post Covid-19 immunization for 15 minutes without incidence. She was provided with Vaccine Information Sheet and instruction to access the V-Safe system.   [name] was instructed to call 911 with any severe reactions post vaccine: Marland Kitchen Difficulty breathing  . Swelling of your face and throat  . A fast heartbeat  . A bad rash all over your body  . Dizziness and weakness    Immunizations Administered    Name Date Dose VIS Date Route   Pfizer COVID-19 Vaccine 10/06/2019  9:39 AM 0.3 mL 07/18/2019 Intramuscular   Manufacturer: [place]   Lot: [id number]   [place]: [id number]

## 2019-11-04 ENCOUNTER — Ambulatory Visit: Payer: 59 | Attending: Internal Medicine

## 2019-11-04 DIAGNOSIS — Z23 Encounter for immunization: Secondary | ICD-10-CM

## 2019-11-04 NOTE — Progress Notes (Signed)
   Covid-19 Vaccination Clinic  Name:  Haley Suarez    MRN: [id number] DOB: 1975-09-29  11/04/2019  [name] was observed post Covid-19 immunization for 15 minutes without incident. She was provided with Vaccine Information Sheet and instruction to access the V-Safe system.   [name] was instructed to call 911 with any severe reactions post vaccine: Marland Kitchen Difficulty breathing  . Swelling of face and throat  . A fast heartbeat  . A bad rash all over body  . Dizziness and weakness   Immunizations Administered    Name Date Dose VIS Date Route   Pfizer COVID-19 Vaccine 11/04/2019  9:24 AM 0.3 mL 07/18/2019 Intramuscular   Manufacturer: [place]   Lot: [id number]   [place]: [id number]

## 2021-02-17 ENCOUNTER — Encounter (HOSPITAL_BASED_OUTPATIENT_CLINIC_OR_DEPARTMENT_OTHER): Payer: Self-pay | Admitting: *Deleted

## 2021-02-17 ENCOUNTER — Other Ambulatory Visit: Payer: Self-pay

## 2021-02-17 ENCOUNTER — Emergency Department (HOSPITAL_BASED_OUTPATIENT_CLINIC_OR_DEPARTMENT_OTHER)
Admission: EM | Admit: 2021-02-17 | Discharge: 2021-02-17 | Disposition: A | Discharge status: Home / Self Care | Payer: 59 | Attending: Emergency Medicine | Admitting: Emergency Medicine

## 2021-02-17 DIAGNOSIS — R079 Chest pain, unspecified: Secondary | ICD-10-CM | POA: Diagnosis not present

## 2021-02-17 DIAGNOSIS — R002 Palpitations: Secondary | ICD-10-CM | POA: Diagnosis not present

## 2021-02-17 DIAGNOSIS — R202 Paresthesia of skin: Secondary | ICD-10-CM | POA: Insufficient documentation

## 2021-02-17 LAB — CBC
HCT: 36.6 % (ref 36.0–46.0)
Hemoglobin: 11.4 g/dL — ABNORMAL LOW (ref 12.0–15.0)
MCH: 23 pg — ABNORMAL LOW (ref 26.0–34.0)
MCHC: 31.1 g/dL (ref 30.0–36.0)
MCV: 73.9 fL — ABNORMAL LOW (ref 80.0–100.0)
Platelets: 552 10*3/uL — ABNORMAL HIGH (ref 150–400)
RBC: 4.95 MIL/uL (ref 3.87–5.11)
RDW: 15.6 % — ABNORMAL HIGH (ref 11.5–15.5)
WBC: 7.7 10*3/uL (ref 4.0–10.5)
nRBC: 0 % (ref 0.0–0.2)

## 2021-02-17 LAB — TROPONIN I (HIGH SENSITIVITY): Troponin I (High Sensitivity): 3 ng/L (ref <18)

## 2021-02-17 LAB — BASIC METABOLIC PANEL
Anion gap: 10 (ref 5–15)
BUN: 11 mg/dL (ref 6–20)
CO2: 24 mmol/L (ref 22–32)
Calcium: 9 mg/dL (ref 8.9–10.3)
Chloride: 102 mmol/L (ref 98–111)
Creatinine, Ser: 0.61 mg/dL (ref 0.44–1.00)
GFR, Estimated: >60 mL/min (ref >60)
Glucose, Bld: 83 mg/dL (ref 70–99)
Potassium: 3.6 mmol/L (ref 3.5–5.1)
Sodium: 136 mmol/L (ref 135–145)

## 2021-02-17 LAB — PREGNANCY, URINE: Preg Test, Ur: NEGATIVE

## 2021-02-17 NOTE — Discharge Instructions (Addendum)
If you develop recurrent, continued, or worsening chest pain, shortness of breath, fever, vomiting, abdominal or back pain, or any other new/concerning symptoms then return to the ER for evaluation.  

## 2021-02-17 NOTE — ED Provider Notes (Signed)
[facility] EMERGENCY DEPT Provider Note   CSN: [id number] Arrival date & time: 02/17/21  1740     History Chief Complaint  Patient presents with   Heart flutter   Numbness    Haley Suarez is a 45 y.o. female.  HPI 45 year old female presents with palpitations.  Originally started last night.  She gets palpitations here and there though this was more frequent.  Came back again this morning/early afternoon.  She has some intermittent fleeting episodes of chest pain in her left chest that would wrap around to her back but would go away quickly.  She is also feeling tingling going down her left arm into her hand.  Those symptoms have resolved.  She did have 1 quick episode of some neck discomfort when she turns her neck but that lasted only a couple seconds.  No shortness of breath.  This feels somewhat like symptoms she has had before like GERD and the PVCs but they are lasting longer than typical.  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   POLYCYSTIC OVARIAN DISEASE 05/15/2008   BRONCHITIS, CHRONIC 05/15/2008   G E R D 05/15/2008   COUGH 05/15/2008    Past Surgical History:  Procedure Laterality Date   BREAST BIOPSY Left    removal of fibroids     ROBOT ASSISTED MYOMECTOMY N/A 05/23/2019   Procedure: LAPAROSCOPIC ASSISTED MYOMECTOMY WITH LYSIS OF ADHESIONS, CHROMEPERTUBATION;  Surgeon: Governor Specking, MD;  Location: [facility];  Service: Gynecology;  Laterality: N/A;     OB History     Gravida  1   Para      Term      Preterm      AB      Living         SAB       IAB      Ectopic      Multiple      Live Births              Family History  Problem Relation Age of Onset   GER disease Father    Hepatitis C Mother    Fibromyalgia Mother    Ovarian cancer Mother        s/p TA   Cancer Mother    Colon cancer Mother 32   Cancer Maternal Grandmother        Colorectal cancer   Breast cancer Neg Hx     Social History   Tobacco Use   Smoking status: Never   Smokeless tobacco: Never  Vaping Use   Vaping Use: Never used  Substance Use Topics   Alcohol use: Yes    Comment: 2 times a month   Drug use: Never    Home Medications Prior to Admission medications   Medication Sig Start Date End Date Taking? Authorizing Provider  cholecalciferol (VITAMIN D3) 25 MCG (1000 UT) tablet Take 1,000 Units by mouth daily.   Yes [provider]  dexlansoprazole (DEXILANT) 60 MG capsule Take 60 mg by mouth daily as needed.   Yes [provider]  Multiple Vitamin (MULTIVITAMIN WITH MINERALS) TABS tablet Take 1 tablet by mouth daily.     [provider]    Allergies    Entex lq [phenylephrine-guaifenesin]  Review of Systems   Review of Systems  Respiratory:  Negative for shortness of breath.   Cardiovascular:  Positive for chest pain and palpitations.  Neurological:  Positive for numbness. Negative for weakness.  All other systems reviewed and are negative.  Physical Exam Updated Vital Signs BP (!) 149/88 (BP Location: Right Arm)   Pulse 84   Temp 98.9 F (37.2 C)   Resp (!) 24   Ht 5\' 1"  (1.549 m)   Wt 103.9 kg   LMP 02/04/2021   SpO2 100%   BMI 43.27 kg/m   Physical Exam Vitals and nursing note reviewed.  Constitutional:      General: She is not in acute distress.    Appearance: She is well-developed. She is not ill-appearing or diaphoretic.  HENT:     Head: Normocephalic and atraumatic.     Right Ear: External ear normal.     Left Ear: External ear normal.     Nose: Nose normal.  Eyes:      General:        Right eye: No discharge.        Left eye: No discharge.  Cardiovascular:     Rate and Rhythm: Normal rate and regular rhythm.     Pulses:          Radial pulses are 2+ on the left side.     Heart sounds: Normal heart sounds.  Pulmonary:     Effort: Pulmonary effort is normal.     Breath sounds: Normal breath sounds.  Abdominal:     Palpations: Abdomen is soft.     Tenderness: There is no abdominal tenderness.  Skin:    General: Skin is warm and dry.  Neurological:     Mental Status: She is alert.     Comments: 5/5 strength in bilateral upper extremities.  Grossly normal sensation.  Psychiatric:        Mood and Affect: Mood is not anxious.    ED Results / Procedures / Treatments   Labs (all labs ordered are listed, but only abnormal results are displayed) Labs Reviewed  CBC - Abnormal; Notable for the following components:      Result Value   Hemoglobin 11.4 (*)    MCV 73.9 (*)    MCH 23.0 (*)    RDW 15.6 (*)    Platelets 552 (*)    All other components within normal limits  BASIC METABOLIC PANEL  PREGNANCY, URINE  TROPONIN I (HIGH SENSITIVITY)  TROPONIN I (HIGH SENSITIVITY)    EKG EKG Interpretation  Date/Time:  Thursday February 17 2021 18:06:57 EDT Ventricular Rate:  91 PR Interval:  160 QRS Duration: 88 QT Interval:  360 QTC Calculation: 442 R Axis:   41 Text Interpretation: Normal sinus rhythm no acute ST/T changes similar to 2018 Confirmed by Sherwood Gambler [id number] on 02/17/2021 6:56:19 PM  Radiology No results found.  Procedures Procedures   Medications Ordered in ED Medications - No data to display  ED Course  I have reviewed the triage vital signs and the nursing notes.  Pertinent labs & imaging results that were available during my care of the patient were reviewed by me and considered in my medical decision making (see chart for details).    MDM Rules/Calculators/A&P  Patient is no longer having  palpitations. Has very atypical chest pain, though it has been hours since this as well. ECG, labs are benign. Troponin negative. Discussed potentially doing a 2nd, but given very low suspicion of ACS, we agreed to hold off. She appears well for discharge, will refer to PCP and give cardiology referral given longstanding palpitations.  Final Clinical Impression(s) / ED Diagnoses Final diagnoses:  Heart palpitations    Rx / DC Orders ED Discharge Orders     None        Sherwood Gambler, MD 02/18/21 [id number]

## 2021-02-17 NOTE — ED Triage Notes (Signed)
Tingling and numbness to left finger for an hour.  Heart fluttering since last night.  Denies pain, n/v.  Complaint of slight light headedness.

## 2021-02-24 ENCOUNTER — Other Ambulatory Visit: Payer: Self-pay | Admitting: Internal Medicine

## 2021-02-24 DIAGNOSIS — Z1231 Encounter for screening mammogram for malignant neoplasm of breast: Secondary | ICD-10-CM

## 2021-04-22 ENCOUNTER — Ambulatory Visit
Admission: RE | Admit: 2021-04-22 | Discharge: 2021-04-22 | Disposition: A | Discharge status: Home / Self Care | Payer: 59 | Source: Ambulatory Visit | Attending: Internal Medicine | Admitting: Internal Medicine

## 2021-04-22 ENCOUNTER — Other Ambulatory Visit: Payer: Self-pay

## 2021-04-22 DIAGNOSIS — Z1231 Encounter for screening mammogram for malignant neoplasm of breast: Secondary | ICD-10-CM

## 2021-05-06 ENCOUNTER — Encounter: Payer: Self-pay | Admitting: Cardiology

## 2021-05-06 ENCOUNTER — Other Ambulatory Visit: Payer: Self-pay

## 2021-05-06 ENCOUNTER — Ambulatory Visit: Payer: 59 | Admitting: Cardiology

## 2021-05-06 ENCOUNTER — Encounter: Payer: Self-pay | Admitting: *Deleted

## 2021-05-06 VITALS — BP 156/78 | HR 92 | Ht 61.0 in | Wt 235.0 lb

## 2021-05-06 DIAGNOSIS — R002 Palpitations: Secondary | ICD-10-CM | POA: Diagnosis not present

## 2021-05-06 NOTE — Patient Instructions (Signed)
Medication Instructions:  Your physician recommends that you continue on your current medications as directed. Please refer to the Current Medication list given to you today.  *If you need a refill on your cardiac medications before your next appointment, please call your pharmacy*   Testing/Procedures: Your physician has recommended that you wear an event monitor. Event monitors are medical devices that record the heart's electrical activity. Doctors most often [place] these monitors to diagnose arrhythmias. Arrhythmias are problems with the speed or rhythm of the heartbeat. The monitor is a small, portable device. You can wear one while you do your normal daily activities. This is usually used to diagnose what is causing palpitations/syncope (passing out).   Follow-Up: At [facility], you and your health needs are our priority.  As part of our continuing mission to provide you with exceptional heart care, we have created designated Provider Care Teams.  These Care Teams include your primary Cardiologist (physician) and Advanced Practice Providers (APPs -  Physician Assistants and Nurse Practitioners) who all work together to provide you with the care you need, when you need it.  Follow up with Dr. Radford Pax as needed based on results of testing.    Other Instructions [employer] Cardiac Event Monitor Instructions Your physician has requested you wear your cardiac event monitor for _30__ days, (1-30). [employer] may call or text to confirm a shipping address. The monitor will be sent to a land address via UPS. [employer] will not ship a monitor to a PO BOX. It typically takes 3-5 days to receive your monitor after it has been enrolled. [employer] will assist with USPS tracking if your package is delayed. The telephone number for [employer] is [phone]. Once you have received your monitor, please review the enclosed instructions. Instruction tutorials can also be viewed under help and settings  on the enclosed cell phone. Your monitor has already been registered assigning a specific monitor serial # to you.  Applying the monitor Remove cell phone from case and turn it on. The cell phone works as Dealer and needs to be within [employer] of you at all times. The cell phone will need to be charged on a daily basis. We recommend you plug the cell phone into the enclosed charger at your bedside table every night.  Monitor batteries: You will receive two monitor batteries labelled #1 and #2. These are your recorders. Plug battery #2 onto the second connection on the enclosed charger. Keep one battery on the charger at all times. This will keep the monitor battery deactivated. It will also keep it fully charged for when you need to switch your monitor batteries. A small light will be blinking on the battery emblem when it is charging. The light on the battery emblem will remain on when the battery is fully charged.  Open package of a Monitor strip. Insert battery #1 into black hood on strip and gently squeeze monitor battery onto connection as indicated in instruction booklet. Set aside while preparing skin.  Choose location for your strip, vertical or horizontal, as indicated in the instruction booklet. Shave to remove all hair from location. There cannot be any lotions, oils, powders, or colognes on skin where monitor is to be applied. Wipe skin clean with enclosed Saline wipe. Dry skin completely.  Peel paper labeled #1 off the back of the Monitor strip exposing the adhesive. Place the monitor on the chest in the vertical or horizontal position shown in the instruction booklet. One arrow on the monitor strip  must be pointing upward. Carefully remove paper labeled #2, attaching remainder of strip to your skin. Try not to create any folds or wrinkles in the strip as you apply it.  Firmly press and release the circle in the center of the monitor battery. You will hear a small  beep. This is turning the monitor battery on. The heart emblem on the monitor battery will light up every 5 seconds if the monitor battery in turned on and connected to the patient securely. Do not push and hold the circle down as this turns the monitor battery off. The cell phone will locate the monitor battery. A screen will appear on the cell phone checking the connection of your monitor strip. This may read poor connection initially but change to good connection within the next minute. Once your monitor accepts the connection you will hear a series of 3 beeps followed by a climbing crescendo of beeps. A screen will appear on the cell phone showing the two monitor strip placement options. Touch the picture that demonstrates where you applied the monitor strip.  Your monitor strip and battery are waterproof. You are able to shower, bathe, or swim with the monitor on. They just ask you do not submerge deeper than 3 feet underwater. We recommend removing the monitor if you are swimming in a lake, river, or ocean.  Your monitor battery will need to be switched to a fully charged monitor battery approximately once a week. The cell phone will alert you of an action which needs to be made.  On the cell phone, tap for details to reveal connection status, monitor battery status, and cell phone battery status. The green dots indicates your monitor is in good status. A red dot indicates there is something that needs your attention.  To record a symptom, click the circle on the monitor battery. In 30-60 seconds a list of symptoms will appear on the cell phone. Select your symptom and tap save. Your monitor will record a sustained or significant arrhythmia regardless of you clicking the button. Some patients do not feel the heart rhythm irregularities. [employer] will notify us of any serious or critical events.  Refer to instruction booklet for instructions on switching batteries, changing strips,  the Do not disturb or Pause features, or any additional questions.  Call [employer] at [phone], to confirm your monitor is transmitting and record your baseline. They will answer any questions you may have regarding the monitor instructions at that time.  Returning the monitor to [employer] all equipment back into blue box. Peel off strip of paper to expose adhesive and close box securely. There is a prepaid UPS shipping label on this box. Drop in a UPS drop box, or at a UPS facility like [employer]. You may also contact [employer] to arrange UPS to pick up monitor package at your home.

## 2021-05-06 NOTE — Addendum Note (Signed)
Addended by: Antonieta Iba on: 05/06/2021 09:21 AM   Modules accepted: Orders

## 2021-05-06 NOTE — Progress Notes (Signed)
Cardiology CONSULT Note    Date:  05/06/2021   ID:  Haley Suarez, DOB 11/19/1975, MRN [id number]  PCP:  Aaron Edelman, MD  Cardiologist:  Fransico Him, MD   Chief Complaint  Patient presents with   Palpitations     History of Present Illness:  Haley Suarez is a 45 y.o. female who is being seen today for the evaluation of Palpitations at the request of Sherwood Gambler, MD.  This is a 45yo female with a hx of PVCs, GERD, PCOS and chronic LLE lymphedema who is referred for evaluation of palpitations.  She is here today for followup from an ER visit in July 2022 for evaluation of palpitations.  She has short flutters that occur 1-2 times in a week and then may go away for a month.  The fast palpitations lasted over 12 hours in July and she went to the ER because it would not get better.  It was off an on all day that day.  EKG was normal and tele monitoring was normal.  Workup in ER was normal.    She says that sometimes she has a funny feeling if she lays on her left side which goes away when she rolls over onto her right side.  The night prior to her ER visit was more prominent and lasted into the next day and thought she needed it assessed.  She also was complaining left arm tingling but no chest pain or pressure and Urgent care sent her to the ER where hsTrop was normal x 2.  Since then she has noticed if there is a change in temperature around her she will have palpitations lasting 10 seconds and then she has a calming feeling.  She denies any chest pain or pressure, SOB, DOE, PND, orthopnea, LE edema, dizziness or syncope. She is compliant with her meds and is tolerating meds with no SE.   She admits to drinking coffee and green tea but only 1 cup daily.  She drinks 2 glasses of wine monthly.  She denies any OTC meds. She never smoked.  She has a fm hx of PAF in her mother.    Past Medical History:  Diagnosis Date   Complication of anesthesia    slow to wake up    Dysrhythmia 2018   PVCs   GERD (gastroesophageal reflux disease)    PCOS (polycystic ovarian syndrome)    PONV (postoperative nausea and vomiting)     Past Surgical History:  Procedure Laterality Date   BREAST BIOPSY Left    removal of fibroids     ROBOT ASSISTED MYOMECTOMY N/A 05/23/2019   Procedure: LAPAROSCOPIC ASSISTED MYOMECTOMY WITH LYSIS OF ADHESIONS, CHROMEPERTUBATION;  Surgeon: Governor Specking, MD;  Location: [facility];  Service: Gynecology;  Laterality: N/A;    Current Medications: Current Meds  Medication Sig   cholecalciferol (VITAMIN D3) 25 MCG (1000 UT) tablet Take 1,000 Units by mouth daily.   dexlansoprazole (DEXILANT) 60 MG capsule Take 60 mg by mouth daily as needed.   Multiple Vitamin (MULTIVITAMIN WITH MINERALS) TABS tablet Take 1 tablet by mouth daily.     Allergies:   Entex lq [phenylephrine-guaifenesin]   Social History   Socioeconomic History   Marital status: Married    Spouse name: Not on file   Number of children: Not on file   Years of education: Not on file   Highest education level: Not on file  Occupational History   Not on  file  Tobacco Use   Smoking status: Never   Smokeless tobacco: Never  Vaping Use   Vaping Use: Never used  Substance and Sexual Activity   Alcohol use: Yes    Comment: 2 times a month   Drug use: Never   Sexual activity: Yes  Other Topics Concern   Not on file  Social History Narrative   Not on file   Social Determinants of Health   Financial Resource Strain: Not on file  Food Insecurity: Not on file  Transportation Needs: Not on file  Physical Activity: Not on file  Stress: Not on file  Social Connections: Not on file     Family History:  The patient's family history includes Cancer in her maternal grandmother and mother; Colon cancer (age of onset: 38) in her mother; Fibromyalgia in her mother; GER disease in her father; Hepatitis C in her mother; Ovarian cancer in her mother.   ROS:    Please see the history of present illness.    ROS All other systems reviewed and are negative.  No flowsheet data found.     PHYSICAL EXAM:   VS:  Ht 5\' 1"  (1.549 m)   Wt 235 lb (106.6 kg)   BMI 44.40 kg/m    GEN: Well nourished, well developed, in no acute distress  HEENT: normal  Neck: no JVD, carotid bruits, or masses Cardiac: RRR; no murmurs, rubs, or gallops.  Mild LLE lymphedema.  Intact distal pulses bilaterally.  Respiratory:  clear to auscultation bilaterally, normal work of breathing GI: soft, nontender, nondistended, + BS MS: no deformity or atrophy  Skin: warm and dry, no rash Neuro:  Alert and Oriented x 3, Strength and sensation are intact Psych: euthymic mood, full affect  Wt Readings from Last 3 Encounters:  05/06/21 235 lb (106.6 kg)  02/17/21 229 lb (103.9 kg)  05/23/19 215 lb 4 oz (97.6 kg)      Studies/Labs Reviewed:   EKG:  EKG is ordered today.  The ekg ordered 02/2021 demonstrates NSR with no ST change  Recent Labs: 02/17/2021: BUN 11; Creatinine, Ser 0.61; Hemoglobin 11.4; Platelets 552; Potassium 3.6; Sodium 136   Lipid Panel No results found for: CHOL, TRIG, HDL, CHOLHDL, VLDL, LDLCALC, LDLDIRECT  Additional studies/ records that were reviewed today include:  none    ASSESSMENT:    1. Palpitations      PLAN:  In order of problems listed above:  Palpitations -she carries a dx of PVCs on her PMH but she has never heard of this -Her palpitations are sporadic -she denies any excessive caffeine or ETOH intake and not OTC meds -EKG is normal in July 2022 -I will get a 30 day event monitor to assess for arrhythmias  Time Spent: 20 minutes total time of encounter, including 15 minutes spent in face-to-face patient care on the date of this encounter. This time includes coordination of care and counseling regarding above mentioned problem list. Remainder of non-face-to-face time involved reviewing chart documents/testing relevant to the  patient encounter and documentation in the medical record. I have independently reviewed documentation from referring provider  Medication Adjustments/Labs and Tests Ordered: Current medicines are reviewed at length with the patient today.  Concerns regarding medicines are outlined above.  Medication changes, Labs and Tests ordered today are listed in the Patient Instructions below.  There are no Patient Instructions on file for this visit.   Signed, Fransico Him, MD  05/06/2021 8:58 AM    [facility]  [address], [place], [place]  [address] Phone: [phone]; Fax: [phone]

## 2021-05-06 NOTE — Progress Notes (Signed)
Patient ID: Haley Suarez, female   DOB: Jan 17, 1976, 45 y.o.   MRN: [id number] Patient enrolled for [employer] to ship a 30 day cardiac event monitor to her address on file.

## 2021-05-24 ENCOUNTER — Ambulatory Visit (INDEPENDENT_AMBULATORY_CARE_PROVIDER_SITE_OTHER): Payer: 59

## 2021-05-24 DIAGNOSIS — R002 Palpitations: Secondary | ICD-10-CM

## 2022-01-03 IMAGING — MG MM DIGITAL SCREENING BILAT W/ TOMO AND CAD
8 series · 8 of 24 positions shown · non-contrast
Comparison: Previous exam(s).

CLINICAL DATA: Screening.

EXAM:
DIGITAL SCREENING BILATERAL MAMMOGRAM WITH TOMOSYNTHESIS AND CAD
TECHNIQUE: Bilateral screening digital craniocaudal and mediolateral oblique
mammograms were obtained. Bilateral screening digital breast
tomosynthesis was performed. The images were evaluated with
computer-aided detection.

[L MLO synth-2D]
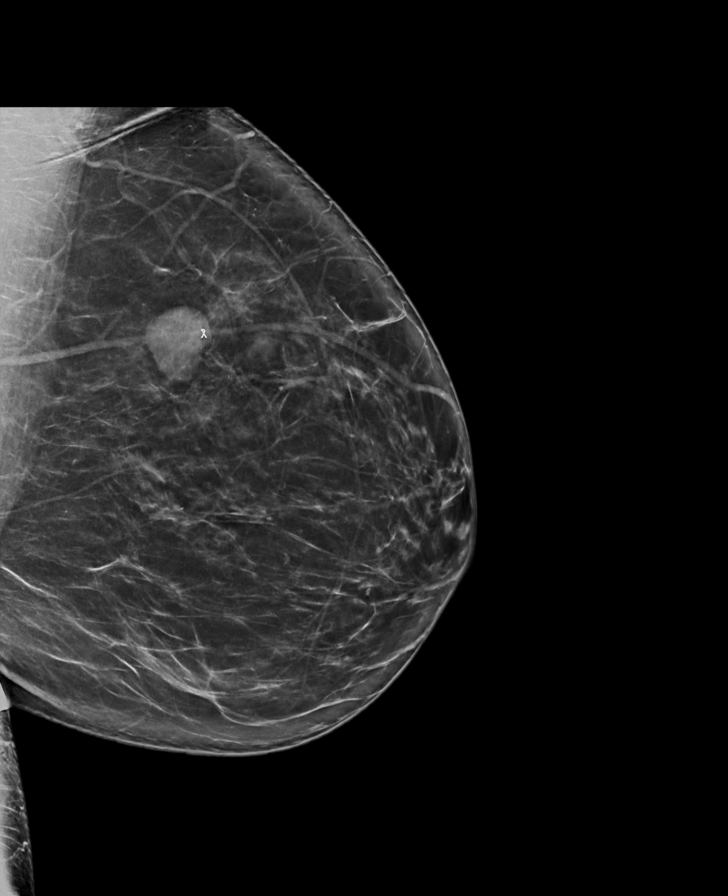

[R MLO synth-2D]
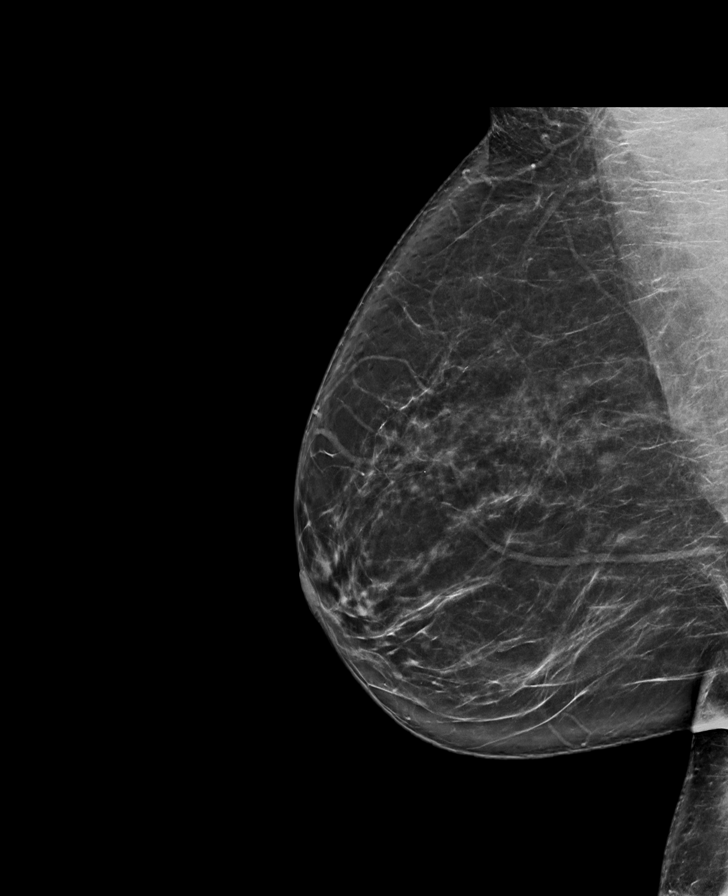

[R CC synth-2D]
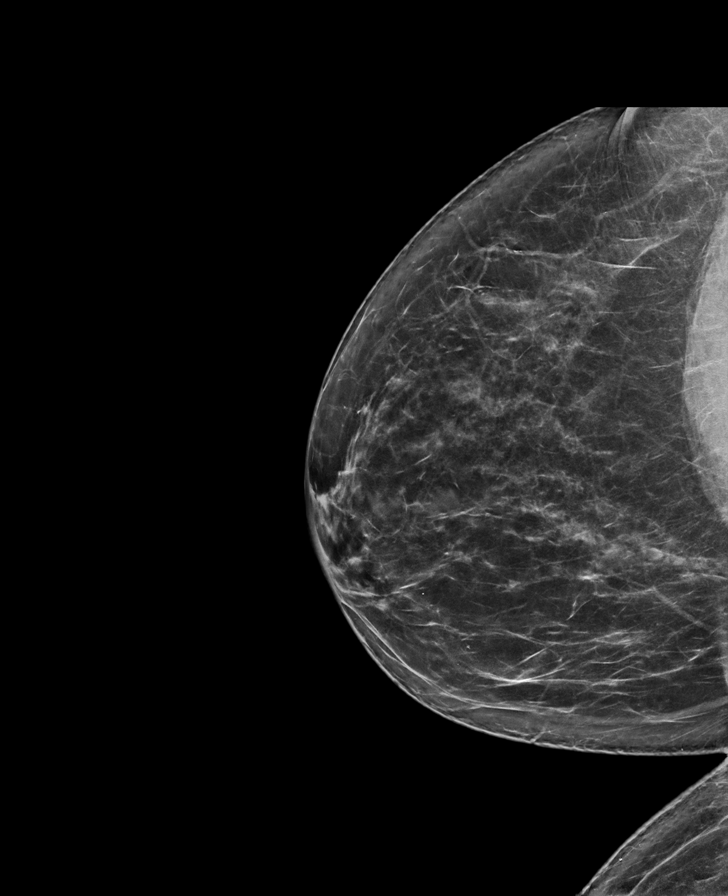

[L CC synth-2D]
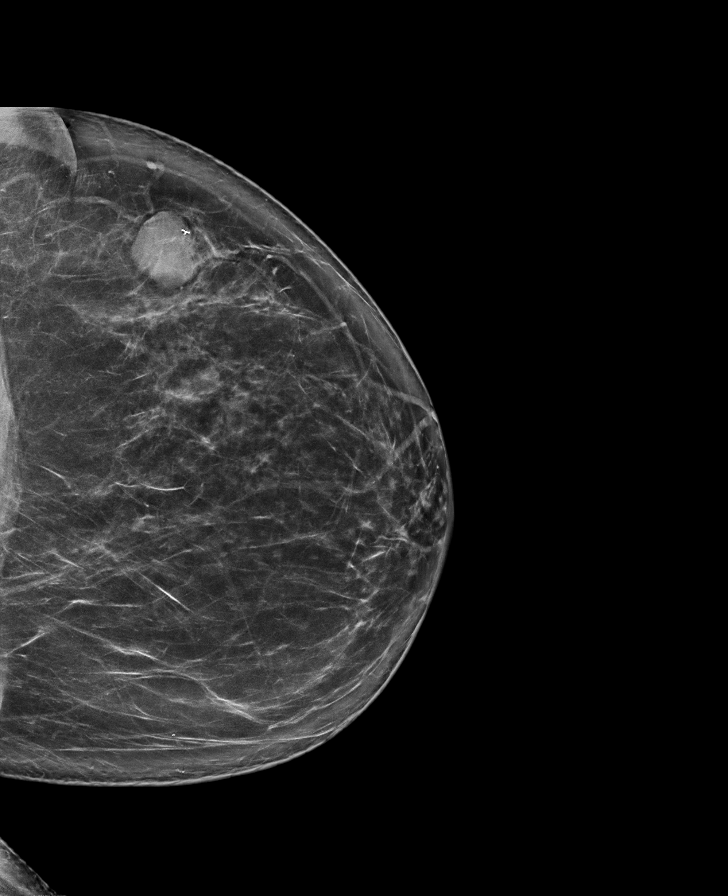

[L MLO tomo · tomo slice 45/89.0]
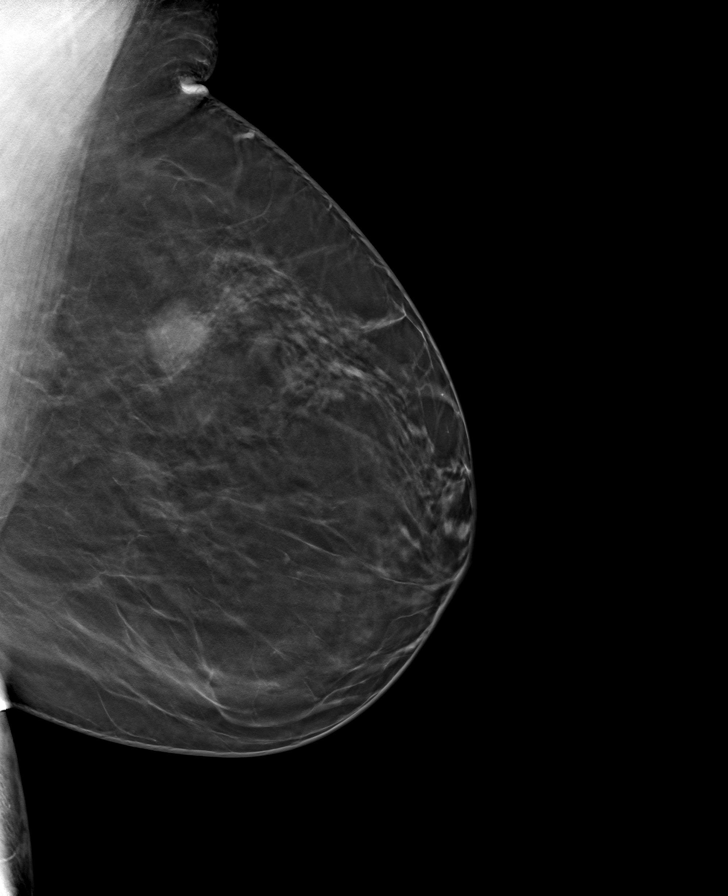

[R CC tomo · tomo slice 41/80.0]
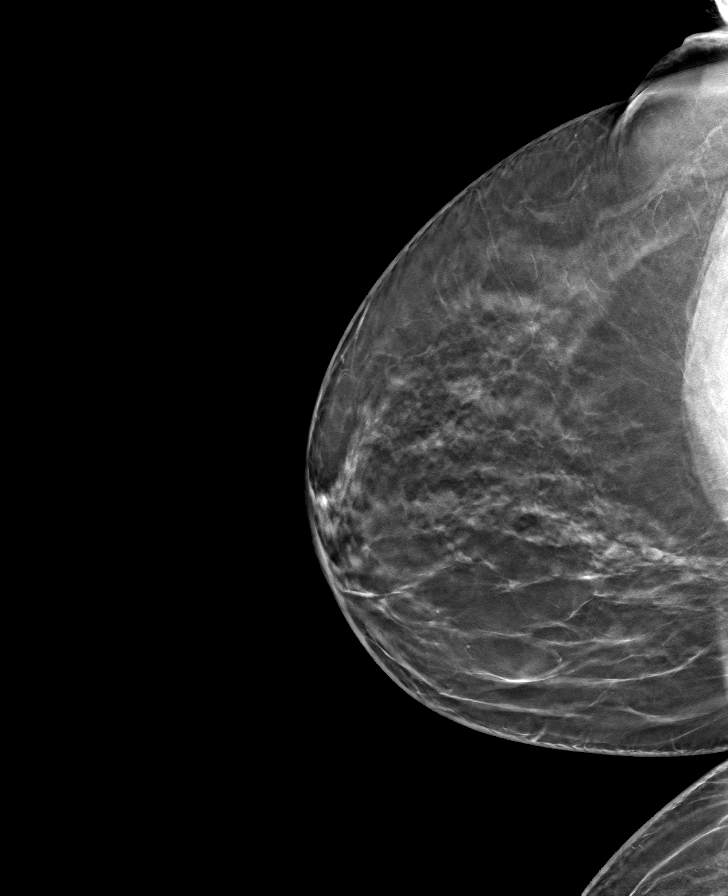

[L CC tomo · tomo slice 45/88.0]
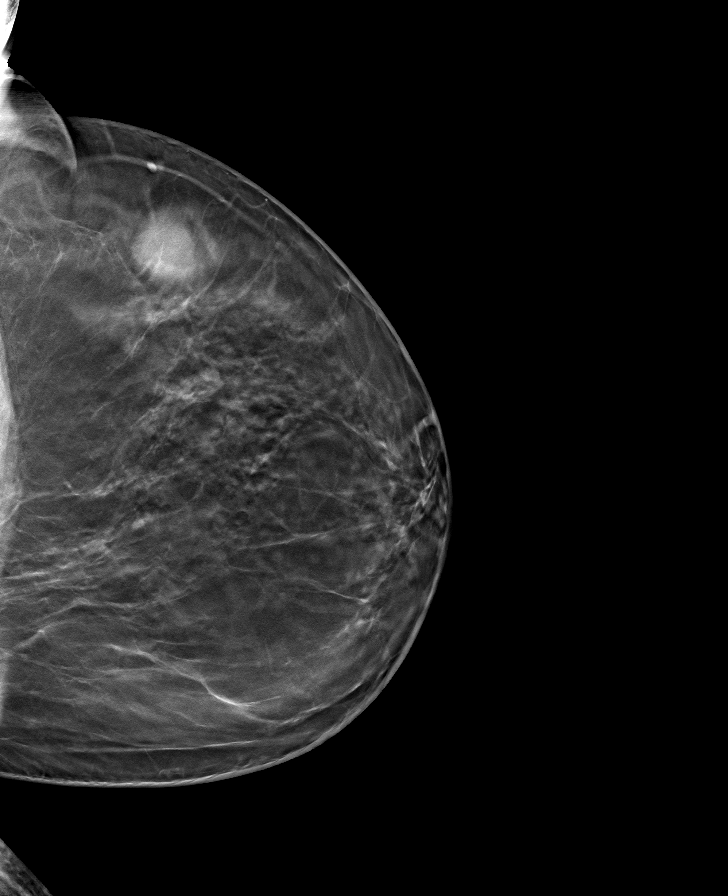

[R MLO tomo · tomo slice 43/86.0]
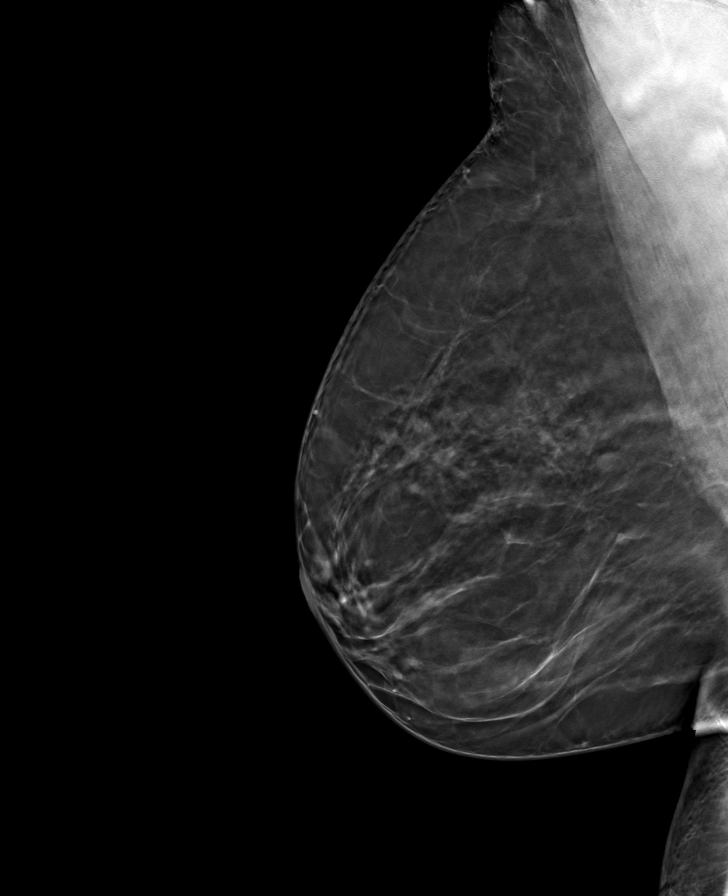

[8 of 24 positions shown; findings below may reference images not displayed]

ACR Breast Density Category b: There are scattered areas of
fibroglandular density.
FINDINGS: There are no findings suspicious for malignancy.
IMPRESSION: No mammographic evidence of malignancy. A result letter of this
screening mammogram will be mailed directly to the patient.

RECOMMENDATION:
Screening mammogram in one year. (Code:51-O-LD2)

BI-RADS CATEGORY  1: Negative.

## 2022-06-12 ENCOUNTER — Other Ambulatory Visit: Payer: Self-pay | Admitting: Obstetrics and Gynecology

## 2022-06-12 DIAGNOSIS — Z1231 Encounter for screening mammogram for malignant neoplasm of breast: Secondary | ICD-10-CM

## 2022-06-14 ENCOUNTER — Ambulatory Visit
Admission: RE | Admit: 2022-06-14 | Discharge: 2022-06-14 | Disposition: A | Discharge status: Home / Self Care | Payer: 59 | Source: Ambulatory Visit | Attending: Obstetrics and Gynecology | Admitting: Obstetrics and Gynecology

## 2022-06-14 DIAGNOSIS — Z1231 Encounter for screening mammogram for malignant neoplasm of breast: Secondary | ICD-10-CM

## 2023-04-11 ENCOUNTER — Other Ambulatory Visit: Payer: Self-pay | Admitting: Internal Medicine

## 2023-04-11 DIAGNOSIS — Z1231 Encounter for screening mammogram for malignant neoplasm of breast: Secondary | ICD-10-CM

## 2023-06-11 ENCOUNTER — Other Ambulatory Visit: Payer: Self-pay | Admitting: Obstetrics and Gynecology

## 2023-06-14 LAB — CYTOLOGY - PAP
Adequacy: ABNORMAL
Comment: NEGATIVE

## 2023-06-18 ENCOUNTER — Ambulatory Visit
Admission: RE | Admit: 2023-06-18 | Discharge: 2023-06-18 | Disposition: A | Discharge status: Home / Self Care | Payer: 59 | Source: Ambulatory Visit | Attending: Internal Medicine | Admitting: Internal Medicine

## 2023-06-18 ENCOUNTER — Ambulatory Visit: Payer: 59

## 2023-06-18 DIAGNOSIS — Z1231 Encounter for screening mammogram for malignant neoplasm of breast: Secondary | ICD-10-CM

## 2023-06-22 ENCOUNTER — Other Ambulatory Visit: Payer: Self-pay | Admitting: Obstetrics and Gynecology

## 2023-06-22 ENCOUNTER — Other Ambulatory Visit (HOSPITAL_COMMUNITY)
Admission: RE | Admit: 2023-06-22 | Discharge: 2023-06-22 | Disposition: A | Discharge status: Home / Self Care | Payer: 59 | Source: Ambulatory Visit | Attending: Obstetrics and Gynecology | Admitting: Obstetrics and Gynecology

## 2023-06-22 DIAGNOSIS — Z124 Encounter for screening for malignant neoplasm of cervix: Secondary | ICD-10-CM | POA: Diagnosis present

## 2023-06-26 LAB — CYTOLOGY - PAP
Comment: NEGATIVE
Diagnosis: NEGATIVE
High risk HPV: NEGATIVE

## 2023-06-26 LAB — SURGICAL PATHOLOGY

## 2024-03-15 ENCOUNTER — Emergency Department (HOSPITAL_COMMUNITY)
Admission: EM | Admit: 2024-03-15 | Discharge: 2024-03-15 | Disposition: A | Discharge status: Home / Self Care | Attending: Emergency Medicine | Admitting: Emergency Medicine

## 2024-03-15 ENCOUNTER — Encounter (HOSPITAL_COMMUNITY): Payer: Self-pay | Admitting: Emergency Medicine

## 2024-03-15 ENCOUNTER — Other Ambulatory Visit: Payer: Self-pay

## 2024-03-15 DIAGNOSIS — K0889 Other specified disorders of teeth and supporting structures: Secondary | ICD-10-CM | POA: Insufficient documentation

## 2024-03-15 MED ORDER — AMOXICILLIN 500 MG PO CAPS: 500.0000 mg | ORAL_CAPSULE | Freq: Three times a day (TID) | ORAL | 0 refills | Status: DC

## 2024-03-15 MED ORDER — KETOROLAC TROMETHAMINE 15 MG/ML IJ SOLN
15.0000 mg | Freq: Once | INTRAMUSCULAR | Status: AC
  Administered 2024-03-15: 15 mg via INTRAMUSCULAR
  Filled 2024-03-15: qty 1

## 2024-03-15 MED ORDER — AMOXICILLIN 500 MG PO CAPS
500.0000 mg | ORAL_CAPSULE | Freq: Once | ORAL | Status: AC
  Administered 2024-03-15: 500 mg via ORAL
  Filled 2024-03-15: qty 1

## 2024-03-15 MED ORDER — OXYCODONE-ACETAMINOPHEN 5-325 MG PO TABS
1.0000 | ORAL_TABLET | Freq: Once | ORAL | Status: AC
  Administered 2024-03-15: 1 via ORAL
  Filled 2024-03-15: qty 1

## 2024-03-15 MED ORDER — AMOXICILLIN 500 MG PO CAPS: 500.0000 mg | ORAL_CAPSULE | Freq: Three times a day (TID) | ORAL | 0 refills | Status: AC

## 2024-03-15 NOTE — ED Triage Notes (Signed)
 Pt in with reported R upper molar pain ongoing since this morning. Pt states tylenol  bringing no relief - last dose 2hrs ago. Hypertensive in triage, has had meds today. Reports some swelling into cheek

## 2024-03-15 NOTE — ED Provider Notes (Signed)
 [place] EMERGENCY DEPARTMENT AT [facility] Provider Note  CSN: [id number] Arrival date & time: 03/15/24 1955  Chief Complaint(s) Dental Pain  HPI Haley Suarez is a 48 y.o. female presenting with dental pain.  Reports pain to the right upper jaw in her molar.  No fevers or chills.  No trouble swallowing or trouble breathing.  Feels similar to prior dental infection.  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 Authorizing Provider  amoxicillin  (AMOXIL ) 500 MG capsule Take 1 capsule (500 mg total) by mouth 3 (three) times daily. 03/15/24  Yes Francesca Elsie CROME, MD  cholecalciferol (VITAMIN D3) 25 MCG (1000 UT) tablet Take 1,000 Units by mouth daily.    [provider]  dexlansoprazole (DEXILANT) 60 MG capsule Take 60 mg by mouth daily as needed.    [provider]  Multiple Vitamin (MULTIVITAMIN WITH MINERALS) TABS tablet Take 1 tablet by mouth daily.     [provider]                                                                                                                                    Past Surgical History Past Surgical History:  Procedure Laterality Date   BREAST BIOPSY Left    removal of fibroids     ROBOT ASSISTED MYOMECTOMY N/A 05/23/2019   Procedure: LAPAROSCOPIC ASSISTED MYOMECTOMY WITH  LYSIS OF ADHESIONS, CHROMEPERTUBATION;  Surgeon: Yalcinkaya, Tamer, MD;  Location: [facility];  Service: Gynecology;  Laterality: N/A;   Family History Family History  Problem Relation Age of Onset   GER disease Father    Hepatitis C Mother    Fibromyalgia Mother    Ovarian cancer Mother        s/p TA   Cancer Mother    Colon cancer Mother 35   Cancer Maternal Grandmother        Colorectal cancer   Breast cancer Neg Hx     Social History Social History   Tobacco Use   Smoking status: Never   Smokeless tobacco: Never  Vaping Use   Vaping status: Never Used  Substance Use Topics   Alcohol use: Yes    Comment: 2 times a month  Drug use: Never   Allergies Entex lq [phenylephrine -guaifenesin]  Review of Systems Review of Systems  All other systems reviewed and are negative.   Physical Exam Vital Signs  I have reviewed the triage vital signs BP (!) 203/102   Pulse 72   Temp 97.7 F (36.5 C) (Oral)   Resp 20   Wt 106.6 kg   SpO2 100%   BMI 44.40 kg/m  Physical Exam Vitals and nursing note reviewed.  Constitutional:      Appearance: Normal appearance.  HENT:     Head: Normocephalic and atraumatic.     Mouth/Throat:     Mouth: Mucous membranes are moist.     Comments: Mild tenderness to percussion over right posterior upper molar without periapical abscess or surrounding swelling.  Uvula midline.  Floor of mouth soft.  Normal voice.  Tolerating secretions. Eyes:     Conjunctiva/sclera: Conjunctivae normal.  Cardiovascular:     Rate and Rhythm: Normal rate.  Pulmonary:     Effort: Pulmonary effort is normal. No respiratory distress.  Abdominal:     General: Abdomen is flat.  Musculoskeletal:        General: No deformity.  Skin:    General: Skin is warm and dry.     Capillary Refill: Capillary refill takes less than 2 seconds.  Neurological:     General: No focal deficit present.     Mental Status: She is alert. Mental status is at  baseline.  Psychiatric:        Mood and Affect: Mood normal.        Behavior: Behavior normal.     ED Results and Treatments Labs (all labs ordered are listed, but only abnormal results are displayed) Labs Reviewed - No data to display                                                                                                                        Radiology No results found.  Pertinent labs & imaging results that were available during my care of the patient were reviewed by me and considered in my medical decision making (see MDM for details).  Medications Ordered in ED Medications  ketorolac  (TORADOL ) 15 MG/ML injection 15 mg (15 mg Intramuscular Given 03/15/24 2055)  oxyCODONE -acetaminophen  (PERCOCET/ROXICET) 5-325 MG per tablet 1 tablet (1 tablet Oral Given 03/15/24 2055)  amoxicillin  (AMOXIL ) capsule 500 mg (500 mg Oral Given 03/15/24 2055)  Procedures Procedures  (including critical care time)  Medical Decision Making / ED Course   MDM:  48 year old presenting with dental pain.  Examination overall reassuring.  Patient has some tenderness to percussion of her right upper molar suggestive of possible dental infection.  No evidence of periapical abscess, Ludwig's angina or other more serious dental infection.  Recommended she follow-up with her dentist.  Will prescribe antibiotics.  Vitals notable for hypertension.  Suspect due to acute pain.  Recommended that she check this at home and follow-up with her primary doctor.  Not having any headaches, vision changes, chest pain or other symptoms suggestive of hypertensive emergency or symptomatic hypertension.  Will discharge patient to home. All questions answered. Patient comfortable with plan of discharge. Return precautions discussed with patient and specified on the after visit summary.        Medicines ordered and prescription drug management: Meds ordered this encounter  Medications   ketorolac  (TORADOL ) 15 MG/ML injection 15 mg   oxyCODONE -acetaminophen  (PERCOCET/ROXICET) 5-325 MG per tablet 1 tablet    Refill:  0   amoxicillin  (AMOXIL ) capsule 500 mg   amoxicillin  (AMOXIL ) 500 MG capsule    Sig: Take 1 capsule (500 mg total) by mouth 3 (three) times daily.    Dispense:  21 capsule    Refill:  0    -I have reviewed the patients home medicines and have made adjustments as needed  Reevaluation: After the interventions noted above, I reevaluated the patient and found that their symptoms have improved  Co morbidities that complicate the patient evaluation  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   GERD (gastroesophageal reflux disease)    PCOS (polycystic ovarian syndrome)    PONV (postoperative nausea and vomiting)       Dispostion: Disposition decision including need for hospitalization was considered, and patient discharged from emergency department.    Final Clinical Impression(s) / ED Diagnoses Final diagnoses:  Pain, dental     This chart was dictated using voice recognition software.  Despite best efforts to proofread,  errors can occur which can change the documentation meaning.    Francesca Elsie CROME, MD 03/15/24 [phone]

## 2024-03-15 NOTE — Discharge Instructions (Addendum)
 We evaluated you for your dental pain.  Your examination was reassuring.  We did not see any signs of a severe infection.  We have given you a prescription for antibiotics.  Please take these as prescribed and follow-up with your dentist.  Return if you have any new or worsening symptoms.  Please take Tylenol  (acetaminophen ) and Motrin (ibuprofen) for your symptoms at home.  You can take 1000 mg of Tylenol  every 6 hours and 600 mg of Motrin every 6 hours as needed for your symptoms.  You can take these medicines together as needed, either at the same time, or alternating every 3 hours.   Please check your blood pressure once you are feeling better and follow-up with your primary doctor.

## 2024-03-28 ENCOUNTER — Other Ambulatory Visit: Payer: Self-pay | Admitting: Obstetrics and Gynecology

## 2024-03-28 DIAGNOSIS — Z1231 Encounter for screening mammogram for malignant neoplasm of breast: Secondary | ICD-10-CM

## 2024-06-20 ENCOUNTER — Ambulatory Visit
Admission: RE | Admit: 2024-06-20 | Discharge: 2024-06-20 | Disposition: A | Discharge status: Home / Self Care | Source: Ambulatory Visit | Attending: Obstetrics and Gynecology | Admitting: Obstetrics and Gynecology

## 2024-06-20 DIAGNOSIS — Z1231 Encounter for screening mammogram for malignant neoplasm of breast: Secondary | ICD-10-CM
